# Patient Record
Sex: Male | Born: 1970 | Race: Black or African American | Hispanic: No | Marital: Married | Smoking: Former smoker
Health system: Southern US, Community
[De-identification: ages and names within clinical notes are randomized; demographics above are authoritative.]

## PROBLEM LIST (undated history)

## (undated) DIAGNOSIS — T7840XA Allergy, unspecified, initial encounter: Secondary | ICD-10-CM

## (undated) DIAGNOSIS — J45909 Unspecified asthma, uncomplicated: Secondary | ICD-10-CM

## (undated) DIAGNOSIS — E669 Obesity, unspecified: Secondary | ICD-10-CM

## (undated) DIAGNOSIS — E291 Testicular hypofunction: Secondary | ICD-10-CM

## (undated) DIAGNOSIS — N200 Calculus of kidney: Secondary | ICD-10-CM

## (undated) DIAGNOSIS — R0902 Hypoxemia: Secondary | ICD-10-CM

## (undated) HISTORY — DX: Calculus of kidney: N20.0

## (undated) HISTORY — DX: Unspecified asthma, uncomplicated: J45.909

## (undated) HISTORY — DX: Allergy, unspecified, initial encounter: T78.40XA

## (undated) HISTORY — DX: Obesity, unspecified: E66.9

## (undated) HISTORY — DX: Testicular hypofunction: E29.1

## (undated) HISTORY — DX: Hypoxemia: R09.02

## (undated) HISTORY — PX: NO PAST SURGERIES: SHX2092

---

## 2012-07-03 ENCOUNTER — Ambulatory Visit (INDEPENDENT_AMBULATORY_CARE_PROVIDER_SITE_OTHER): Payer: Worker's Compensation

## 2012-07-03 ENCOUNTER — Other Ambulatory Visit: Payer: Self-pay | Admitting: Family Medicine

## 2012-07-03 DIAGNOSIS — R52 Pain, unspecified: Secondary | ICD-10-CM

## 2012-07-03 DIAGNOSIS — M25569 Pain in unspecified knee: Secondary | ICD-10-CM

## 2012-07-03 DIAGNOSIS — M25469 Effusion, unspecified knee: Secondary | ICD-10-CM

## 2012-07-03 DIAGNOSIS — S99929A Unspecified injury of unspecified foot, initial encounter: Secondary | ICD-10-CM

## 2012-07-03 DIAGNOSIS — X500XXA Overexertion from strenuous movement or load, initial encounter: Secondary | ICD-10-CM

## 2012-07-09 ENCOUNTER — Encounter: Payer: Self-pay | Admitting: Sports Medicine

## 2012-07-09 ENCOUNTER — Ambulatory Visit (INDEPENDENT_AMBULATORY_CARE_PROVIDER_SITE_OTHER): Payer: Worker's Compensation | Admitting: Sports Medicine

## 2012-07-09 VITALS — BP 113/75 | HR 66 | Ht 69.5 in | Wt 232.0 lb

## 2012-07-09 DIAGNOSIS — J302 Other seasonal allergic rhinitis: Secondary | ICD-10-CM | POA: Insufficient documentation

## 2012-07-09 DIAGNOSIS — H6120 Impacted cerumen, unspecified ear: Secondary | ICD-10-CM | POA: Insufficient documentation

## 2012-07-09 DIAGNOSIS — L909 Atrophic disorder of skin, unspecified: Secondary | ICD-10-CM

## 2012-07-09 DIAGNOSIS — Z299 Encounter for prophylactic measures, unspecified: Secondary | ICD-10-CM

## 2012-07-09 DIAGNOSIS — Z0000 Encounter for general adult medical examination without abnormal findings: Secondary | ICD-10-CM | POA: Insufficient documentation

## 2012-07-09 DIAGNOSIS — L918 Other hypertrophic disorders of the skin: Secondary | ICD-10-CM | POA: Insufficient documentation

## 2012-07-09 DIAGNOSIS — J309 Allergic rhinitis, unspecified: Secondary | ICD-10-CM

## 2012-07-09 MED ORDER — MONTELUKAST SODIUM 10 MG PO TABS: 10.0000 mg | ORAL_TABLET | Freq: Every day | ORAL | Status: DC

## 2012-07-09 NOTE — Assessment & Plan Note (Signed)
Well controlled with Singulair, needs a refill.

## 2012-07-09 NOTE — Progress Notes (Signed)
Subjective:    CC: Establish care.   HPI:  Hearing: Jose Rojas does get frequent cerumen impactions, he feels he has some today, and is wondering if we can flush the ears.  Allergies: Needs refill of Singulair, and this works well.  Skin lesions: Has multiple on his back and neck and is wondering what they are. They're nonpainful, and are not changing very much.  Preventative measure: Desires complete physical exam and some screening blood work.  Past medical history, Surgical history, Family history not pertinant except as noted below, Social history, Allergies, and medications have been entered into the medical record, reviewed, and no changes needed.   Review of Systems: No headache, visual changes, nausea, vomiting, diarrhea, constipation, dizziness, abdominal pain, skin rash, fevers, chills, night sweats, swollen lymph nodes, weight loss, chest pain, body aches, joint swelling, muscle aches, shortness of breath, mood changes, visual or auditory hallucinations.  Objective:    General: Well Developed, well nourished, and in no acute distress.  Neuro: Alert and oriented x3, extra-ocular muscles intact, sensation grossly intact.  HEENT: Normocephalic, atraumatic, pupils equal round reactive to light, neck supple, no masses, no lymphadenopathy, thyroid nonpalpable. Oropharynx, nasopharynx unremarkable, external ear canals show cerumen impactions bilaterally, we flushed the ears and I removed from both sides a large amount of cerumen manually with a curette. Skin: Warm and dry, no rashes noted. Multiple skin tags, approximately 15 noted on neck and upper back. Cardiac: Regular rate and rhythm, no murmurs rubs or gallops.  Respiratory: Clear to auscultation bilaterally. Not using accessory muscles, speaking in full sentences.  Abdominal: Soft, nontender, nondistended, positive bowel sounds, no masses, no organomegaly.  Musculoskeletal: Shoulder, elbow, wrist, hip, knee, ankle stable, and with full  range of motion. Impression and Recommendations:   The patient was counselled, risk factors were discussed, anticipatory guidance given.

## 2012-07-09 NOTE — Assessment & Plan Note (Signed)
Flushed. I also removed a significant amount manually with a curette.

## 2012-07-09 NOTE — Assessment & Plan Note (Signed)
There are multiple on the neck and back, he will schedule an appointment to have these removed.

## 2012-07-09 NOTE — Assessment & Plan Note (Signed)
Checking some routine blood work, he will then come to see me for a separate visit for a complete physical and preventive exam.

## 2012-07-09 NOTE — Addendum Note (Signed)
Addended by: Monica Becton on: 07/09/2012 03:17 PM   Modules accepted: Orders

## 2012-07-10 LAB — COMPREHENSIVE METABOLIC PANEL WITH GFR
Albumin: 4.6 g/dL (ref 3.5–5.2)
CO2: 27 meq/L (ref 19–32)
Calcium: 9.6 mg/dL (ref 8.4–10.5)
Chloride: 106 meq/L (ref 96–112)
Glucose, Bld: 100 mg/dL — ABNORMAL HIGH (ref 70–99)
Potassium: 4.3 meq/L (ref 3.5–5.3)
Sodium: 139 meq/L (ref 135–145)
Total Bilirubin: 0.8 mg/dL (ref 0.3–1.2)
Total Protein: 7.1 g/dL (ref 6.0–8.3)

## 2012-07-10 LAB — CBC
HCT: 42.5 % (ref 39.0–52.0)
Hemoglobin: 14.7 g/dL (ref 13.0–17.0)
MCH: 28.3 pg (ref 26.0–34.0)
MCHC: 34.6 g/dL (ref 30.0–36.0)
MCV: 81.9 fL (ref 78.0–100.0)
Platelets: 163 10*3/uL (ref 150–400)
RBC: 5.19 MIL/uL (ref 4.22–5.81)
RDW: 16.3 % — ABNORMAL HIGH (ref 11.5–15.5)
WBC: 6.6 10*3/uL (ref 4.0–10.5)

## 2012-07-10 LAB — HEMOGLOBIN A1C
Hgb A1c MFr Bld: 5.5 % (ref <5.7)
Mean Plasma Glucose: 111 mg/dL (ref <117)

## 2012-07-10 LAB — COMPREHENSIVE METABOLIC PANEL
ALT: 28 U/L (ref 0–53)
AST: 20 U/L (ref 0–37)
Alkaline Phosphatase: 66 U/L (ref 39–117)
BUN: 11 mg/dL (ref 6–23)
Creat: 0.94 mg/dL (ref 0.50–1.35)

## 2012-07-10 LAB — LIPID PANEL
Cholesterol: 173 mg/dL (ref 0–200)
HDL: 37 mg/dL — ABNORMAL LOW (ref >39)
LDL Cholesterol: 117 mg/dL — ABNORMAL HIGH (ref 0–99)
Total CHOL/HDL Ratio: 4.7 Ratio
Triglycerides: 94 mg/dL (ref <150)
VLDL: 19 mg/dL (ref 0–40)

## 2012-07-10 LAB — TSH: TSH: 1.445 u[IU]/mL (ref 0.350–4.500)

## 2012-07-11 LAB — VITAMIN D 25 HYDROXY (VIT D DEFICIENCY, FRACTURES): Vit D, 25-Hydroxy: 23 ng/mL — ABNORMAL LOW (ref 30–89)

## 2012-07-12 MED ORDER — VITAMIN D (ERGOCALCIFEROL) 1.25 MG (50000 UNIT) PO CAPS: 50000.0000 [IU] | ORAL_CAPSULE | ORAL | Status: DC

## 2012-07-12 NOTE — Addendum Note (Signed)
Addended by: Monica Becton on: 07/12/2012 10:38 AM   Modules accepted: Orders

## 2012-07-13 LAB — TESTOSTERONE, FREE, TOTAL, SHBG
Sex Hormone Binding: 25 nmol/L (ref 13–71)
Testosterone, Free: 65.5 pg/mL (ref 47.0–244.0)
Testosterone-% Free: 2.3 % (ref 1.6–2.9)
Testosterone: 287 ng/dL — ABNORMAL LOW (ref 300–890)

## 2012-07-15 ENCOUNTER — Encounter: Payer: Self-pay | Admitting: Sports Medicine

## 2012-07-15 ENCOUNTER — Ambulatory Visit (INDEPENDENT_AMBULATORY_CARE_PROVIDER_SITE_OTHER): Payer: BC Managed Care – PPO | Admitting: Sports Medicine

## 2012-07-15 VITALS — BP 129/87 | HR 61 | Wt 233.0 lb

## 2012-07-15 DIAGNOSIS — L910 Hypertrophic scar: Secondary | ICD-10-CM | POA: Insufficient documentation

## 2012-07-15 DIAGNOSIS — J45909 Unspecified asthma, uncomplicated: Secondary | ICD-10-CM | POA: Insufficient documentation

## 2012-07-15 DIAGNOSIS — L731 Pseudofolliculitis barbae: Secondary | ICD-10-CM | POA: Insufficient documentation

## 2012-07-15 DIAGNOSIS — E291 Testicular hypofunction: Secondary | ICD-10-CM | POA: Insufficient documentation

## 2012-07-15 DIAGNOSIS — Z299 Encounter for prophylactic measures, unspecified: Secondary | ICD-10-CM

## 2012-07-15 DIAGNOSIS — Z0000 Encounter for general adult medical examination without abnormal findings: Secondary | ICD-10-CM

## 2012-07-15 DIAGNOSIS — L918 Other hypertrophic disorders of the skin: Secondary | ICD-10-CM

## 2012-07-15 MED ORDER — ALBUTEROL SULFATE HFA 108 (90 BASE) MCG/ACT IN AERS: 2.0000 | INHALATION_SPRAY | RESPIRATORY_TRACT | Status: DC | PRN

## 2012-07-15 NOTE — Patient Instructions (Addendum)
Exercise prescription:  You should adjust the intensity of your exercise based on your heart rate. The [employer] sports medicine recommends keeping your heart rate between 70-80% of its maximum for 30 minutes, 3-5 times per week. Maximum heart rate = (220 - age). Multiply this number by 0.75 to get your goal heart rate. If lower, then increase the intensity of your exercise. If the number is higher, you may decrease the intensity of your exercise.  Fat and Cholesterol Control Diet Cholesterol levels in your body are determined significantly by your diet. Cholesterol levels may also be related to heart disease. The following material helps to explain this relationship and discusses what you can do to help keep your heart healthy. Not all cholesterol is bad. Low-density lipoprotein (LDL) cholesterol is the "bad" cholesterol. It may cause fatty deposits to build up inside your arteries. High-density lipoprotein (HDL) cholesterol is "good." It helps to remove the "bad" LDL cholesterol from your blood. Cholesterol is a very important risk factor for heart disease. Other risk factors are high blood pressure, smoking, stress, heredity, and weight. The heart muscle gets its supply of blood through the coronary arteries. If your LDL cholesterol is high and your HDL cholesterol is low, you are at risk for having fatty deposits build up in your coronary arteries. This leaves less room through which blood can flow. Without sufficient blood and oxygen, the heart muscle cannot function properly and you may feel chest pains (angina pectoris). When a coronary artery closes up entirely, a part of the heart muscle may die causing a heart attack (myocardial infarction). CHECKING CHOLESTEROL When your caregiver sends your blood to a lab to be examined for cholesterol, a complete lipid (fat) profile may be done. With this test, the total amount of cholesterol and levels of LDL and HDL are determined. Triglycerides  are a type of fat that circulates in the blood. They can also be used to determine heart disease risk. The list below describes what the numbers should be: Test: Total Cholesterol.  Less than 200 mg/dl. Test: LDL "bad cholesterol."  Less than 100 mg/dl.  Less than 70 mg/dl if you are at very high risk of a heart attack or sudden cardiac death. Test: HDL "good cholesterol."  Greater than 50 mg/dl for women.  Greater than 40 mg/dl for men. Test: Triglycerides.  Less than 150 mg/dl. CONTROLLING CHOLESTEROL WITH DIET Although exercise and lifestyle factors are important, your diet is key. That is because certain foods are known to raise cholesterol and others to lower it. The goal is to balance foods for their effect on cholesterol and more importantly, to replace saturated and trans fat with other types of fat, such as monounsaturated fat, polyunsaturated fat, and omega-3 fatty acids. On average, a person should consume no more than 15 to 17 g of saturated fat daily. Saturated and trans fats are considered "bad" fats, and they will raise LDL cholesterol. Saturated fats are primarily found in animal products such as meats, butter, and cream. However, that does not mean you need to give up all your favorite foods. Today, there are good tasting, low-fat, low-cholesterol substitutes for most of the things you like to eat. Choose low-fat or nonfat alternatives. Choose round or loin cuts of red meat. These types of cuts are lowest in fat and cholesterol. Chicken (without the skin), fish, veal, and ground [place] breast are great choices. Eliminate fatty meats, such as hot dogs and salami. Even shellfish have little or no saturated  fat. Have a 3 oz (85 g) portion when you eat lean meat, poultry, or fish. Trans fats are also called "partially hydrogenated oils." They are oils that have been scientifically manipulated so that they are solid at room temperature resulting in a longer shelf life and improved  taste and texture of foods in which they are added. Trans fats are found in stick margarine, some tub margarines, cookies, crackers, and baked goods.  When baking and cooking, oils are a great substitute for butter. The monounsaturated oils are especially beneficial since it is believed they lower LDL and raise HDL. The oils you should avoid entirely are saturated tropical oils, such as coconut and palm.  Remember to eat a lot from food groups that are naturally free of saturated and trans fat, including fish, fruit, vegetables, beans, grains (barley, rice, couscous, bulgur wheat), and pasta (without cream sauces).  IDENTIFYING FOODS THAT LOWER CHOLESTEROL  Soluble fiber may lower your cholesterol. This type of fiber is found in fruits such as apples, vegetables such as broccoli, potatoes, and carrots, legumes such as beans, peas, and lentils, and grains such as barley. Foods fortified with plant sterols (phytosterol) may also lower cholesterol. You should eat at least 2 g per day of these foods for a cholesterol lowering effect.  Read package labels to identify low-saturated fats, trans fat free, and low-fat foods at the supermarket. Select cheeses that have only 2 to 3 g saturated fat per ounce. Use a heart-healthy tub margarine that is free of trans fats or partially hydrogenated oil. When buying baked goods (cookies, crackers), avoid partially hydrogenated oils. Breads and muffins should be made from whole grains (whole-wheat or whole oat flour, instead of "flour" or "enriched flour"). Buy non-creamy canned soups with reduced salt and no added fats.  FOOD PREPARATION TECHNIQUES  Never deep-fry. If you must fry, either stir-fry, which uses very little fat, or use non-stick cooking sprays. When possible, broil, bake, or roast meats, and steam vegetables. Instead of putting butter or margarine on vegetables, use lemon and herbs, applesauce, and cinnamon (for squash and sweet potatoes), nonfat yogurt, salsa,  and low-fat dressings for salads.  LOW-SATURATED FAT / LOW-FAT FOOD SUBSTITUTES Meats / Saturated Fat (g)  Avoid: Steak, marbled (3 oz/85 g) / 11 g  Choose: Steak, lean (3 oz/85 g) / 4 g  Avoid: Hamburger (3 oz/85 g) / 7 g  Choose: Hamburger, lean (3 oz/85 g) / 5 g  Avoid: Ham (3 oz/85 g) / 6 g  Choose: Ham, lean cut (3 oz/85 g) / 2.4 g  Avoid: Chicken, with skin, dark meat (3 oz/85 g) / 4 g  Choose: Chicken, skin removed, dark meat (3 oz/85 g) / 2 g  Avoid: Chicken, with skin, light meat (3 oz/85 g) / 2.5 g  Choose: Chicken, skin removed, light meat (3 oz/85 g) / 1 g Dairy / Saturated Fat (g)  Avoid: Whole milk (1 cup) / 5 g  Choose: Low-fat milk, 2% (1 cup) / 3 g  Choose: Low-fat milk, 1% (1 cup) / 1.5 g  Choose: Skim milk (1 cup) / 0.3 g  Avoid: Hard cheese (1 oz/28 g) / 6 g  Choose: Skim milk cheese (1 oz/28 g) / 2 to 3 g  Avoid: Cottage cheese, 4% fat (1 cup) / 6.5 g  Choose: Low-fat cottage cheese, 1% fat (1 cup) / 1.5 g  Avoid: Ice cream (1 cup) / 9 g  Choose: Sherbet (1 cup) / 2.5 g  Choose:  Nonfat frozen yogurt (1 cup) / 0.3 g  Choose: Frozen fruit bar / trace  Avoid: Whipped cream (1 tbs) / 3.5 g  Choose: Nondairy whipped topping (1 tbs) / 1 g Condiments / Saturated Fat (g)  Avoid: Mayonnaise (1 tbs) / 2 g  Choose: Low-fat mayonnaise (1 tbs) / 1 g  Avoid: Butter (1 tbs) / 7 g  Choose: Extra light margarine (1 tbs) / 1 g  Avoid: Coconut oil (1 tbs) / 11.8 g  Choose: Olive oil (1 tbs) / 1.8 g  Choose: Corn oil (1 tbs) / 1.7 g  Choose: Safflower oil (1 tbs) / 1.2 g  Choose: Sunflower oil (1 tbs) / 1.4 g  Choose: Soybean oil (1 tbs) / 2.4 g  Choose: Canola oil (1 tbs) / 1 g Document Released: 05/20/2005 Document Revised: 08/12/2011 Document Reviewed: 11/08/2010 ExitCare Patient Information 2013 ExitCare,     What is this medicine? TESTOSTERONE (tes TOS ter one) is the main male hormone. It supports normal male development such  as muscle growth, facial hair, and deep voice. It is used in males to treat low testosterone levels. This medicine may be used for other purposes; ask your health care provider or pharmacist if you have questions. What should I tell my health care provider before I take this medicine? They need to know if you have any of these conditions: -breast cancer -diabetes -heart disease -kidney disease -liver disease -lung disease -prostate cancer, enlargement -an unusual or allergic reaction to testosterone, other medicines, foods, dyes, or preservatives -pregnant or trying to get pregnant -breast-feeding How should I use this medicine? This medicine is for injection into a muscle. It is usually given by a health care professional in a hospital or clinic setting. Contact your pediatrician regarding the use of this medicine in children. While this medicine may be prescribed for children as young as 65 years of age for selected conditions, precautions do apply. Overdosage: If you think you have taken too much of this medicine contact a poison control center or emergency room at once. NOTE: This medicine is only for you. Do not share this medicine with others. What if I miss a dose? Try not to miss a dose. Your doctor or health care professional will tell you when your next injection is due. Notify the office if you are unable to keep an appointment. What may interact with this medicine? -medicines for diabetes -medicines that treat or prevent blood clots like warfarin -oxyphenbutazone -propranolol -steroid medicines like prednisone or cortisone This list may not describe all possible interactions. Give your health care provider a list of all the medicines, herbs, non-prescription drugs, or dietary supplements you use. Also tell them if you smoke, drink alcohol, or use illegal drugs. Some items may interact with your medicine. What should I watch for while using this medicine? Visit your doctor or  health care professional for regular checks on your progress. They will need to check the level of testosterone in your blood. This medicine may affect blood sugar levels. If you have diabetes, check with your doctor or health care professional before you change your diet or the dose of your diabetic medicine. This drug is banned from use in athletes by most athletic organizations. What side effects may I notice from receiving this medicine? Side effects that you should report to your doctor or health care professional as soon as possible: -allergic reactions like skin rash, itching or hives, swelling of the face, lips, or tongue -breast enlargement -  breathing problems -changes in mood, especially anger, depression, or rage -dark urine -general ill feeling or flu-like symptoms -light-colored stools -loss of appetite, nausea -nausea, vomiting -right upper belly pain -stomach pain -swelling of ankles -too frequent or persistent erections -trouble passing urine or change in the amount of urine -unusually weak or tired -yellowing of the eyes or skin Additional side effects that can occur in women include: -deep or hoarse voice -facial hair growth -irregular menstrual periods Side effects that usually do not require medical attention (report to your doctor or health care professional if they continue or are bothersome): -acne -change in sex drive or performance -hair loss -headache This list may not describe all possible side effects. Call your doctor for medical advice about side effects. You may report side effects to FDA at 1-800-FDA-1088. Where should I keep my medicine? Keep out of the reach of children. This medicine can be abused. Keep your medicine in a safe place to protect it from theft. Do not share this medicine with anyone. Selling or giving away this medicine is dangerous and against the law. Store at room temperature between 20 and 25 degrees C (68 and 77 degrees F). Do not  freeze. Protect from light. Follow the directions for the product you are prescribed. Throw away any unused medicine after the expiration date. NOTE: This sheet is a summary. It may not cover all possible information. If you have questions about this medicine, talk to your doctor, pharmacist, or health care provider.  2013, [employer]. (07/31/2007 4:13:46 PM)

## 2012-07-15 NOTE — Assessment & Plan Note (Signed)
Note written to allow him to grow a bearded work. At some point we could certainly consider a vitamin A topical analog.

## 2012-07-15 NOTE — Assessment & Plan Note (Signed)
I've advised him that if he desires we can consider intralesional steroid injections to shrink the keloid.

## 2012-07-15 NOTE — Assessment & Plan Note (Signed)
We discussed weight loss, and exercise. I'm giving him an exercise prescription. He will think about testosterone supplementation.

## 2012-07-15 NOTE — Assessment & Plan Note (Signed)
Complete physical performed today. 

## 2012-07-15 NOTE — Assessment & Plan Note (Signed)
We'll make a separate visit for excision.

## 2012-07-15 NOTE — Assessment & Plan Note (Signed)
Refilling albuterol. 

## 2012-07-15 NOTE — Progress Notes (Signed)
Subjective:    CC: Complete physical  HPI: Jose Rojas presents for complete physical exam, he does have several issues he would like to discuss.  Keloid: Present on chest, he did have one in the past on his ear, he was excised, and returned as expected, it was then injected and disappeared.  Skin tags: Understands he does need a separate appointment to excise these. He has them on his neck and in his axilla.  Hypogonadism: Recent testosterone level was low, he has no problem with sex drive or erections, but does have some fatigue and difficulty gaining we muscle mass.  Lipids: LDL was elevated but free mildly so, he would like to try dietary modification.  Asthma: Needs refill on albuterol.  Facial rash: Due to his job he is required to be clean shaven, unfortunately he develops multiple bumps, irritation, redness, and swelling with shaving. He is wondering if we can write him a note to grow a beard at work.  Past medical history, Surgical history, Family history not pertinant except as noted below, Social history, Allergies, and medications have been entered into the medical record, reviewed, and no changes needed.   Review of Systems: No headache, visual changes, nausea, vomiting, diarrhea, constipation, dizziness, abdominal pain, skin rash, fevers, chills, night sweats, swollen lymph nodes, weight loss, chest pain, body aches, joint swelling, muscle aches, shortness of breath, mood changes, visual or auditory hallucinations.  Objective:   General Appearance: Alert, well developed and well nourished, cooperative, no distress.  Head: Normocephalic, no obvious abnormality  Eyes: PERRL, EOM's intact, conjunctiva and corneas clear.  Nose: Nares symmetrical, septum midline, mucosa pink, clear watery discharge; no sinus tenderness  Throat: Lips, tongue, and mucosa are moist, pink, and intact; teeth intact  Neck: Supple, symmetrical, trachea midline, no adenopathy; thyroid: no enlargement,  symmetric,no tenderness/mass/nodules; no carotid bruit, no JVD  Back: Symmetrical, no curvature, ROM normal, no CVA tenderness  Chest/Breast: No mass or tenderness, there is a 3 cm keloid on his anterior chest. Lungs: Clear to auscultation bilaterally, respirations unlabored  Heart: Normal PMI, no lower extremity edema, regular rate & rhythm, S1 and S2 normal, no murmurs, rubs, or gallops. Abdomen: Soft, non-tender, bowel sounds active all four quadrants, no mass, or organomegaly  Musculoskeletal: All joints, extremities examined, non-tender and unremarkable.  Lymphatic: No adenopathy  Skin/Hair/Nails: Skin warm, dry, and intact, no abnormal dyspigmentation, there are multiple papules located on his face in a beard distribution. Neurologic: Alert and oriented x3, no cranial nerve deficits, normal strength and tone, gait steady  Impression and Recommendations:   The patient was counselled, risk factors were discussed, anticipatory guidance given.

## 2013-01-28 ENCOUNTER — Emergency Department (HOSPITAL_BASED_OUTPATIENT_CLINIC_OR_DEPARTMENT_OTHER)
Admission: EM | Admit: 2013-01-28 | Discharge: 2013-01-29 | Disposition: A | Discharge status: Home / Self Care | Payer: Worker's Compensation | Attending: Emergency Medicine | Admitting: Emergency Medicine

## 2013-01-28 ENCOUNTER — Encounter (HOSPITAL_BASED_OUTPATIENT_CLINIC_OR_DEPARTMENT_OTHER): Payer: Self-pay | Admitting: *Deleted

## 2013-01-28 DIAGNOSIS — Z79899 Other long term (current) drug therapy: Secondary | ICD-10-CM | POA: Insufficient documentation

## 2013-01-28 DIAGNOSIS — Y9389 Activity, other specified: Secondary | ICD-10-CM | POA: Insufficient documentation

## 2013-01-28 DIAGNOSIS — Z23 Encounter for immunization: Secondary | ICD-10-CM | POA: Insufficient documentation

## 2013-01-28 DIAGNOSIS — Y92410 Unspecified street and highway as the place of occurrence of the external cause: Secondary | ICD-10-CM | POA: Insufficient documentation

## 2013-01-28 DIAGNOSIS — W268XXA Contact with other sharp object(s), not elsewhere classified, initial encounter: Secondary | ICD-10-CM | POA: Insufficient documentation

## 2013-01-28 DIAGNOSIS — Z87891 Personal history of nicotine dependence: Secondary | ICD-10-CM | POA: Insufficient documentation

## 2013-01-28 DIAGNOSIS — S058X9A Other injuries of unspecified eye and orbit, initial encounter: Secondary | ICD-10-CM | POA: Insufficient documentation

## 2013-01-28 DIAGNOSIS — S01111A Laceration without foreign body of right eyelid and periocular area, initial encounter: Secondary | ICD-10-CM
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
## 2013-01-29 MED ORDER — TETANUS-DIPHTH-ACELL PERTUSSIS 5-2.5-18.5 LF-MCG/0.5 IM SUSP
0.5000 mL | Freq: Once | INTRAMUSCULAR | Status: AC
  Administered 2013-01-29: 0.5 mL via INTRAMUSCULAR
  Filled 2013-01-29: qty 0.5
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
## 2013-02-04 ENCOUNTER — Ambulatory Visit (INDEPENDENT_AMBULATORY_CARE_PROVIDER_SITE_OTHER): Payer: BC Managed Care – PPO | Admitting: Sports Medicine

## 2013-02-04 ENCOUNTER — Encounter: Payer: Self-pay | Admitting: Sports Medicine

## 2013-02-04 VITALS — BP 137/88 | HR 61 | Wt 227.0 lb

## 2013-02-04 DIAGNOSIS — E291 Testicular hypofunction: Secondary | ICD-10-CM

## 2013-02-04 MED ORDER — TESTOSTERONE CYPIONATE 200 MG/ML IM SOLN
200.0000 mg | INTRAMUSCULAR | Status: DC
  Administered 2013-02-04: 200 mg via INTRAMUSCULAR

## 2013-02-04 NOTE — Progress Notes (Signed)
  Subjective:    CC: Testosterone  HPI: Male hypogonadism: We does note decreased sex drive, poor erections, fatigue. He does desire to start testosterone supplementation. He prefers parenteral.  He works in [place], and will have some trouble getting here but he will make time out of his day for the next month or 2, by then I hope to train him to do his own shots at home.  Past medical history, Surgical history, Family history not pertinant except as noted below, Social history, Allergies, and medications have been entered into the medical record, reviewed, and no changes needed.   Review of Systems: No fevers, chills, night sweats, weight loss, chest pain, or shortness of breath.   Objective:    General: Well Developed, well nourished, and in no acute distress.  Neuro: Alert and oriented x3, extra-ocular muscles intact, sensation grossly intact.  HEENT: Normocephalic, atraumatic, pupils equal round reactive to light, neck supple, no masses, no lymphadenopathy, thyroid nonpalpable.  Skin: Warm and dry, no rashes. Cardiac: Regular rate and rhythm, no murmurs rubs or gallops, no lower extremity edema.  Respiratory: Clear to auscultation bilaterally. Not using accessory muscles, speaking in full sentences.  Impression and Recommendations:

## 2013-02-04 NOTE — Assessment & Plan Note (Signed)
Testosterone 200 mg intramuscular. We will do this every 2 weeks, we will check testosterone levels one week after his second injection. I would like him to learn how to do his own shots.

## 2013-05-04 ENCOUNTER — Telehealth: Payer: Self-pay

## 2013-05-04 DIAGNOSIS — E291 Testicular hypofunction: Secondary | ICD-10-CM

## 2013-05-04 MED ORDER — TESTOSTERONE CYPIONATE 200 MG/ML IM SOLN: 200.0000 mg | INTRAMUSCULAR | Status: DC

## 2013-05-04 NOTE — Telephone Encounter (Signed)
Sent prescription

## 2013-05-05 ENCOUNTER — Other Ambulatory Visit: Payer: Self-pay

## 2013-05-28 ENCOUNTER — Ambulatory Visit (INDEPENDENT_AMBULATORY_CARE_PROVIDER_SITE_OTHER): Payer: Self-pay | Admitting: Sports Medicine

## 2013-05-28 VITALS — BP 148/96 | HR 68 | Wt 233.0 lb

## 2013-05-28 DIAGNOSIS — E291 Testicular hypofunction: Secondary | ICD-10-CM

## 2013-05-28 MED ORDER — TESTOSTERONE CYPIONATE 200 MG/ML IM SOLN
200.0000 mg | INTRAMUSCULAR | Status: DC
  Administered 2013-05-28: 200 mg via INTRAMUSCULAR

## 2013-05-28 NOTE — Assessment & Plan Note (Signed)
Testosterone injection as above. 

## 2013-05-28 NOTE — Progress Notes (Signed)
Patient was seen for Testosterone injection, 200 mg Testosterone Cypionate was given RUOQ. Patient denied any symptoms associated with the injection.  Patient will have his labs done to check testosterone 1 week from today. Rhonda Cunningham,CMA

## 2013-06-07 LAB — TESTOSTERONE, FREE, TOTAL, SHBG
Sex Hormone Binding: 23 nmol/L (ref 13–71)
Testosterone, Free: 163.9 pg/mL (ref 47.0–244.0)
Testosterone-% Free: 2.6 % (ref 1.6–2.9)
Testosterone: 625 ng/dL (ref 300–890)

## 2013-06-10 ENCOUNTER — Telehealth: Payer: Self-pay

## 2013-06-10 NOTE — Telephone Encounter (Signed)
Spoke to patient he stated that he felt a little different from when he got the first injection, he stated that he was working out at the time. Patient has appt scheduled for 06/14/13 and he will see how he feels after this injection and call to let us know. Brynn Reznik,CMA

## 2013-06-10 NOTE — Telephone Encounter (Signed)
Testosterone levels are normal because of the injections, if we stop them they would go back down. Is he feeling any better now that the levels are normal.

## 2013-06-10 NOTE — Telephone Encounter (Signed)
Patient wants to know that since his lab results were normal do he still need to continue the testosterone injections? Chaquita Basques,CMA

## 2013-06-14 ENCOUNTER — Ambulatory Visit (INDEPENDENT_AMBULATORY_CARE_PROVIDER_SITE_OTHER): Payer: Managed Care, Other (non HMO) | Admitting: Sports Medicine

## 2013-06-14 ENCOUNTER — Encounter: Payer: Self-pay | Admitting: *Deleted

## 2013-06-14 VITALS — BP 146/89 | HR 75 | Wt 234.0 lb

## 2013-06-14 DIAGNOSIS — E291 Testicular hypofunction: Secondary | ICD-10-CM

## 2013-06-14 MED ORDER — TESTOSTERONE CYPIONATE 200 MG/ML IM SOLN
200.0000 mg | INTRAMUSCULAR | Status: DC
  Administered 2013-06-14: 200 mg via INTRAMUSCULAR

## 2013-06-14 NOTE — Progress Notes (Signed)
Testosterone injection given.

## 2013-06-14 NOTE — Assessment & Plan Note (Signed)
Testosterone injection given as above.

## 2013-06-16 ENCOUNTER — Telehealth: Payer: Self-pay

## 2013-06-16 NOTE — Telephone Encounter (Signed)
Patient called stated that he was in the office on 06/14/13 for Testosterone injection and he stated that he has been having headaches and feeling dizzy he wants to know what he should do at this point. He stated that he did not have these symptoms when he first got the injection done.  Rhonda Cunningham,CMA

## 2013-06-16 NOTE — Telephone Encounter (Signed)
Please have him see me tomorrow if headaches continue. This is unlikely due to testosterone supplementation.

## 2013-06-16 NOTE — Telephone Encounter (Signed)
Spoke to patient advised him to schedule appt to come in for headaches. Rhonda Cunningham,CMA

## 2013-06-25 ENCOUNTER — Ambulatory Visit (INDEPENDENT_AMBULATORY_CARE_PROVIDER_SITE_OTHER): Payer: Managed Care, Other (non HMO) | Admitting: Sports Medicine

## 2013-06-25 ENCOUNTER — Encounter: Payer: Self-pay | Admitting: Sports Medicine

## 2013-06-25 VITALS — BP 131/89 | HR 65 | Wt 229.0 lb

## 2013-06-25 DIAGNOSIS — E291 Testicular hypofunction: Secondary | ICD-10-CM

## 2013-06-25 MED ORDER — TESTOSTERONE CYPIONATE 200 MG/ML IM SOLN
200.0000 mg | Freq: Once | INTRAMUSCULAR | Status: AC
  Administered 2013-06-25: 200 mg via INTRAMUSCULAR

## 2013-06-25 MED ORDER — TADALAFIL 5 MG PO TABS: 5.0000 mg | ORAL_TABLET | Freq: Every day | ORAL | Status: DC

## 2013-06-25 NOTE — Progress Notes (Signed)
  Subjective:    CC: Followup  HPI: Male hypogonadism colon excellent response with improved strength and libido after his first testosterone injection, unfortunately he waited 3-4 months before his next, and after the next injection he developed a throbbing unilateral headache that was worsened with bright lights and loud sounds. He then had his third injection and although he didn't get the same improvement in libido energy, he did not develop a headache. Since then he's been somewhat hesitant to continue with testosterone supplementation. Symptoms were moderate, persistent, his headache has resolved. No focal neurologic symptoms, no constitutional symptoms.  Past medical history, Surgical history, Family history not pertinant except as noted below, Social history, Allergies, and medications have been entered into the medical record, reviewed, and no changes needed.   Review of Systems: No fevers, chills, night sweats, weight loss, chest pain, or shortness of breath.   Objective:    General: Well Developed, well nourished, and in no acute distress.  Neuro: Alert and oriented x3, extra-ocular muscles intact, sensation grossly intact. Cranial nerves II through XII are intact, motor, sensory, and coordinative functions are all intact. HEENT: Normocephalic, atraumatic, pupils equal round reactive to light, neck supple, no masses, no lymphadenopathy, thyroid nonpalpable.  Skin: Warm and dry, no rashes. Cardiac: Regular rate and rhythm, no murmurs rubs or gallops, no lower extremity edema.  Respiratory: Clear to auscultation bilaterally. Not using accessory muscles, speaking in full sentences.  Impression and Recommendations:

## 2013-06-25 NOTE — Assessment & Plan Note (Signed)
Initially Jose Rojas had a fantastic response to testosterone supplementation. He skipped a few months, on the restart he had a bit of a headache, I do think that this was simply a coincidence with his testosterone injection. He will continue every 2 week injections I am also going to give him some Cialis samples. I would like to see him back in 3 months to evaluate response

## 2013-06-28 ENCOUNTER — Ambulatory Visit: Payer: Self-pay

## 2013-07-14 ENCOUNTER — Ambulatory Visit (INDEPENDENT_AMBULATORY_CARE_PROVIDER_SITE_OTHER): Payer: Managed Care, Other (non HMO) | Admitting: Sports Medicine

## 2013-07-14 DIAGNOSIS — E291 Testicular hypofunction: Secondary | ICD-10-CM

## 2013-07-14 MED ORDER — TESTOSTERONE CYPIONATE 200 MG/ML IM SOLN
200.0000 mg | INTRAMUSCULAR | Status: DC
  Administered 2013-07-14: 200 mg via INTRAMUSCULAR

## 2013-07-14 NOTE — Progress Notes (Signed)
   Subjective:    Patient ID: Jose Rojas, male    DOB: [birth year], 43 y.o.   MRN: [id number]  HPI    Review of Systems     Objective:   Physical Exam        Assessment & Plan:  Testosterone injection 234m given left ventrogluteal.  Pt tolerated well without any complications. KClemetine Marker LPN

## 2013-07-14 NOTE — Assessment & Plan Note (Signed)
Testosterone injection as above.

## 2013-07-23 ENCOUNTER — Encounter: Payer: Self-pay | Admitting: Sports Medicine

## 2013-07-23 ENCOUNTER — Ambulatory Visit (INDEPENDENT_AMBULATORY_CARE_PROVIDER_SITE_OTHER): Payer: Managed Care, Other (non HMO) | Admitting: Sports Medicine

## 2013-07-23 VITALS — BP 134/90 | HR 67 | Ht 69.0 in | Wt 230.0 lb

## 2013-07-23 DIAGNOSIS — E291 Testicular hypofunction: Secondary | ICD-10-CM

## 2013-07-23 DIAGNOSIS — J45909 Unspecified asthma, uncomplicated: Secondary | ICD-10-CM

## 2013-07-23 DIAGNOSIS — H6120 Impacted cerumen, unspecified ear: Secondary | ICD-10-CM

## 2013-07-23 DIAGNOSIS — J302 Other seasonal allergic rhinitis: Secondary | ICD-10-CM

## 2013-07-23 DIAGNOSIS — Z0000 Encounter for general adult medical examination without abnormal findings: Secondary | ICD-10-CM

## 2013-07-23 DIAGNOSIS — Z299 Encounter for prophylactic measures, unspecified: Secondary | ICD-10-CM

## 2013-07-23 DIAGNOSIS — L731 Pseudofolliculitis barbae: Secondary | ICD-10-CM

## 2013-07-23 MED ORDER — ALBUTEROL SULFATE HFA 108 (90 BASE) MCG/ACT IN AERS: 2.0000 | INHALATION_SPRAY | RESPIRATORY_TRACT | Status: DC | PRN

## 2013-07-23 MED ORDER — MONTELUKAST SODIUM 10 MG PO TABS: 10.0000 mg | ORAL_TABLET | Freq: Every day | ORAL | Status: DC

## 2013-07-23 NOTE — Progress Notes (Signed)
  Subjective:    CC: Complete physical  HPI:  Preventive measures: Doing well, here for a complete physical.  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  Hypogonadism: Doing extremely well with current dose of testosterone.  Past medical history, Surgical history, Family history not pertinant except as noted below, Social history, Allergies, and medications have been entered into the medical record, reviewed, and no changes needed.   Review of Systems: No headache, visual changes, nausea, vomiting, diarrhea, constipation, dizziness, abdominal pain, skin rash, fevers, chills, night sweats, swollen lymph nodes, weight loss, chest pain, body aches, joint swelling, muscle aches, shortness of breath, mood changes, visual or auditory hallucinations.  Objective:    General: Well Developed, well nourished, and in no acute distress.  Neuro: Alert and oriented x3, extra-ocular muscles intact, sensation grossly intact.  HEENT: Normocephalic, atraumatic, pupils equal round reactive to light, neck supple, no masses, no lymphadenopathy, thyroid nonpalpable. Oropharynx, nasopharynx, extremity or canal are unremarkable with the exception of the right extremity which is occluded with cerumen. Skin: Warm and dry, no rashes noted.  Cardiac: Regular rate and rhythm, no murmurs rubs or gallops.  Respiratory: Clear to auscultation bilaterally. Not using accessory muscles, speaking in full sentences.  Abdominal: Soft, nontender, nondistended, positive bowel sounds, no masses, no organomegaly.  Musculoskeletal: Shoulder, elbow, wrist, hip, knee, ankle stable, and with full range of motion.  Indication: Cerumen impaction of the ear(s) Medical necessity statement: On physical examination, cerumen impairs clinically significant portions of the external auditory canal, and tympanic membrane. Noted obstructive, copious cerumen that cannot be removed without magnification and instrumentations  requiring physician skills Consent: Discussed benefits and risks of procedure and verbal consent obtained Procedure: Patient was prepped for the procedure. Utilized an otoscope to assess and take note of the ear canal, the tympanic membrane, and the presence, amount, and placement of the cerumen. Gentle water irrigation and soft plastic curette was utilized to remove cerumen.  Post procedure examination: shows cerumen was completely removed. Patient tolerated procedure well. The patient is made aware that they may experience temporary vertigo, temporary hearing loss, and temporary discomfort. If these symptom last for more than 24 hours to call the clinic or proceed to the ED.  Impression and Recommendations:    The patient was counselled, risk factors were discussed, anticipatory guidance given.

## 2013-07-23 NOTE — Assessment & Plan Note (Signed)
Improved with growing out his beard.

## 2013-07-23 NOTE — Assessment & Plan Note (Signed)
Doing well, rechecking CBC, PSA. Testosterone levels last month were 600, and he feels significantly better.

## 2013-07-23 NOTE — Assessment & Plan Note (Signed)
Complete physical performed today.

## 2013-07-23 NOTE — Assessment & Plan Note (Signed)
Removed with curettage by physician and irrigation.

## 2013-08-11 ENCOUNTER — Ambulatory Visit (INDEPENDENT_AMBULATORY_CARE_PROVIDER_SITE_OTHER): Payer: Managed Care, Other (non HMO) | Admitting: Sports Medicine

## 2013-08-11 VITALS — BP 142/97 | HR 72 | Ht 69.0 in | Wt 226.0 lb

## 2013-08-11 DIAGNOSIS — E291 Testicular hypofunction: Secondary | ICD-10-CM

## 2013-08-11 LAB — CBC
HCT: 44 % (ref 39.0–52.0)
Hemoglobin: 15.2 g/dL (ref 13.0–17.0)
MCH: 28.5 pg (ref 26.0–34.0)
MCHC: 34.5 g/dL (ref 30.0–36.0)
MCV: 82.4 fL (ref 78.0–100.0)
Platelets: 175 10*3/uL (ref 150–400)
RBC: 5.34 MIL/uL (ref 4.22–5.81)
RDW: 15.8 % — ABNORMAL HIGH (ref 11.5–15.5)
WBC: 6.4 10*3/uL (ref 4.0–10.5)

## 2013-08-11 LAB — LIPID PANEL
Cholesterol: 166 mg/dL (ref 0–200)
HDL: 37 mg/dL — ABNORMAL LOW (ref >39)
LDL Cholesterol: 111 mg/dL — ABNORMAL HIGH (ref 0–99)
Total CHOL/HDL Ratio: 4.5 ratio
Triglycerides: 88 mg/dL (ref <150)
VLDL: 18 mg/dL (ref 0–40)

## 2013-08-11 LAB — HEMOGLOBIN A1C
Hgb A1c MFr Bld: 5.3 % (ref <5.7)
Mean Plasma Glucose: 105 mg/dL (ref <117)

## 2013-08-11 LAB — PSA, TOTAL AND FREE
PSA, Free Pct: 42 % (ref >25)
PSA, Free: 0.52 ng/mL
PSA: 1.25 ng/mL (ref <=4.00)

## 2013-08-11 MED ORDER — TESTOSTERONE CYPIONATE 200 MG/ML IM SOLN
200.0000 mg | INTRAMUSCULAR | Status: DC
  Administered 2013-08-11: 200 mg via INTRAMUSCULAR

## 2013-08-11 NOTE — Assessment & Plan Note (Signed)
Testosterone injection as above.

## 2013-08-11 NOTE — Progress Notes (Signed)
Patient was in office for testosterone injection. 200 mg DepoTestosterone was given RUOQ. Patient denied any headaches, mood swings or SOB. Shanti Agresti,CMA

## 2013-08-25 ENCOUNTER — Encounter: Payer: Self-pay | Admitting: *Deleted

## 2013-08-25 ENCOUNTER — Ambulatory Visit (INDEPENDENT_AMBULATORY_CARE_PROVIDER_SITE_OTHER): Payer: Managed Care, Other (non HMO) | Admitting: Sports Medicine

## 2013-08-25 VITALS — BP 140/89 | HR 61

## 2013-08-25 DIAGNOSIS — E291 Testicular hypofunction: Secondary | ICD-10-CM

## 2013-08-25 MED ORDER — TESTOSTERONE CYPIONATE 200 MG/ML IM SOLN
200.0000 mg | Freq: Once | INTRAMUSCULAR | Status: AC
  Administered 2013-08-25: 200 mg via INTRAMUSCULAR

## 2013-08-25 NOTE — Progress Notes (Signed)
   Subjective:    Patient ID: Jose Rojas, male    DOB: 10-Nov-1970, 43 y.o.   MRN: [id number] Pt here for testosterone injection. 273m given in RUOQ with no complications.  MOscar La LPN  HPI    Review of Systems     Objective:   Physical Exam        Assessment & Plan:

## 2013-08-25 NOTE — Assessment & Plan Note (Signed)
Testosterone injection as above.

## 2013-09-09 ENCOUNTER — Encounter: Payer: Self-pay | Admitting: *Deleted

## 2013-09-09 ENCOUNTER — Ambulatory Visit (INDEPENDENT_AMBULATORY_CARE_PROVIDER_SITE_OTHER): Payer: Managed Care, Other (non HMO) | Admitting: Sports Medicine

## 2013-09-09 VITALS — BP 119/77 | HR 59 | Wt 231.0 lb

## 2013-09-09 DIAGNOSIS — E291 Testicular hypofunction: Secondary | ICD-10-CM

## 2013-09-09 MED ORDER — TESTOSTERONE CYPIONATE 200 MG/ML IM SOLN
200.0000 mg | Freq: Once | INTRAMUSCULAR | Status: AC
  Administered 2013-09-09: 200 mg via INTRAMUSCULAR

## 2013-09-09 NOTE — Assessment & Plan Note (Signed)
Testosterone injection as above.

## 2013-09-09 NOTE — Progress Notes (Signed)
   Subjective:    Patient ID: Jose Rojas, male    DOB: April 04, 1971, 43 y.o.   MRN: [id number] Testosterone injection given IM, LUOQ.  No complications. No complaints of any side effects. Beatris Ship, CMA  HPI    Review of Systems     Objective:   Physical Exam        Assessment & Plan:

## 2013-09-23 ENCOUNTER — Ambulatory Visit: Payer: Self-pay | Admitting: *Deleted

## 2013-09-23 ENCOUNTER — Ambulatory Visit (INDEPENDENT_AMBULATORY_CARE_PROVIDER_SITE_OTHER): Payer: Managed Care, Other (non HMO) | Admitting: Sports Medicine

## 2013-09-23 ENCOUNTER — Encounter: Payer: Self-pay | Admitting: Sports Medicine

## 2013-09-23 VITALS — BP 127/78 | HR 61 | Ht 69.0 in | Wt 232.0 lb

## 2013-09-23 DIAGNOSIS — J309 Allergic rhinitis, unspecified: Secondary | ICD-10-CM

## 2013-09-23 DIAGNOSIS — E291 Testicular hypofunction: Secondary | ICD-10-CM

## 2013-09-23 DIAGNOSIS — Z299 Encounter for prophylactic measures, unspecified: Secondary | ICD-10-CM

## 2013-09-23 DIAGNOSIS — L738 Other specified follicular disorders: Secondary | ICD-10-CM

## 2013-09-23 DIAGNOSIS — L731 Pseudofolliculitis barbae: Secondary | ICD-10-CM

## 2013-09-23 DIAGNOSIS — L678 Other hair color and hair shaft abnormalities: Secondary | ICD-10-CM

## 2013-09-23 DIAGNOSIS — J302 Other seasonal allergic rhinitis: Secondary | ICD-10-CM

## 2013-09-23 MED ORDER — TESTOSTERONE CYPIONATE 200 MG/ML IM SOLN
200.0000 mg | INTRAMUSCULAR | Status: DC
  Administered 2013-09-23: 200 mg via INTRAMUSCULAR

## 2013-09-23 MED ORDER — LORATADINE-PSEUDOEPHEDRINE ER 10-240 MG PO TB24: 1.0000 | ORAL_TABLET | Freq: Every day | ORAL | Status: DC

## 2013-09-23 NOTE — Assessment & Plan Note (Signed)
Return in one year for complete physical.

## 2013-09-23 NOTE — Assessment & Plan Note (Signed)
Thinking about having another child with his wife, unfortunately this would mean we would need to stop testosterone injections, he's also had a vasectomy, he would need a referral to [facility] urology for a reversal. He we will discuss this once they make that decision.

## 2013-09-23 NOTE — Assessment & Plan Note (Signed)
Prescription for Claritin-D. Return as needed

## 2013-09-23 NOTE — Assessment & Plan Note (Signed)
Resolved

## 2013-09-23 NOTE — Progress Notes (Signed)
  Subjective:    CC: Followup  HPI: Hypogonadism : feeling much better in terms of sex drive, ability to gangrene muscle mass, and energy after starting testosterone supplementation. Most recent testosterone levels were in the mid 600s.  Family planning: Status post vasectomy, now desiring to have another child with his wife, he has 2 girls, he would like boy. He's not yet fully made the decision, he is going to discuss this with his wife as we would have to discontinue testosterone supplementation and he would have to see urologist for reversal of vasectomy.  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  Allergies: Needs a prescription for Claritin-D.  Past medical history, Surgical history, Family history not pertinant except as noted below, Social history, Allergies, and medications have been entered into the medical record, reviewed, and no changes needed.   Review of Systems: No fevers, chills, night sweats, weight loss, chest pain, or shortness of breath.   Objective:    General: Well Developed, well nourished, and in no acute distress.  Neuro: Alert and oriented x3, extra-ocular muscles intact, sensation grossly intact.  HEENT: Normocephalic, atraumatic, pupils equal round reactive to light, neck supple, no masses, no lymphadenopathy, thyroid nonpalpable.  Skin: Warm and dry, no rashes. Cardiac: Regular rate and rhythm, no murmurs rubs or gallops, no lower extremity edema.  Respiratory: Clear to auscultation bilaterally. Not using accessory muscles, speaking in full sentences.  Impression and Recommendations:

## 2013-10-07 ENCOUNTER — Ambulatory Visit (INDEPENDENT_AMBULATORY_CARE_PROVIDER_SITE_OTHER): Payer: Managed Care, Other (non HMO) | Admitting: Sports Medicine

## 2013-10-07 ENCOUNTER — Encounter: Payer: Self-pay | Admitting: *Deleted

## 2013-10-07 VITALS — BP 127/78 | HR 72 | Wt 229.0 lb

## 2013-10-07 DIAGNOSIS — E291 Testicular hypofunction: Secondary | ICD-10-CM

## 2013-10-07 MED ORDER — TESTOSTERONE CYPIONATE 200 MG/ML IM SOLN
200.0000 mg | Freq: Once | INTRAMUSCULAR | Status: AC
  Administered 2013-10-07: 200 mg via INTRAMUSCULAR

## 2013-10-07 NOTE — Progress Notes (Signed)
   Subjective:    Patient ID: Jose Rojas, male    DOB: [birth year], 43 y.o.   MRN: [id number] Testosterone given IM LUOQ. No complications. Beatris Ship, CMA HPI    Review of Systems     Objective:   Physical Exam        Assessment & Plan:

## 2013-10-07 NOTE — Assessment & Plan Note (Signed)
Testosterone injection as above.

## 2013-11-19 ENCOUNTER — Ambulatory Visit: Payer: Self-pay

## 2014-10-10 ENCOUNTER — Other Ambulatory Visit: Payer: Self-pay

## 2014-10-10 DIAGNOSIS — J302 Other seasonal allergic rhinitis: Secondary | ICD-10-CM

## 2014-10-10 MED ORDER — MONTELUKAST SODIUM 10 MG PO TABS: 10.0000 mg | ORAL_TABLET | Freq: Every day | ORAL | Status: DC

## 2014-10-17 ENCOUNTER — Ambulatory Visit (INDEPENDENT_AMBULATORY_CARE_PROVIDER_SITE_OTHER): Payer: Managed Care, Other (non HMO) | Admitting: Sports Medicine

## 2014-10-17 ENCOUNTER — Encounter: Payer: Self-pay | Admitting: Sports Medicine

## 2014-10-17 VITALS — BP 126/80 | HR 70 | Ht 69.0 in | Wt 214.0 lb

## 2014-10-17 DIAGNOSIS — J4520 Mild intermittent asthma, uncomplicated: Secondary | ICD-10-CM

## 2014-10-17 DIAGNOSIS — E291 Testicular hypofunction: Secondary | ICD-10-CM

## 2014-10-17 DIAGNOSIS — J302 Other seasonal allergic rhinitis: Secondary | ICD-10-CM | POA: Diagnosis not present

## 2014-10-17 DIAGNOSIS — Z0000 Encounter for general adult medical examination without abnormal findings: Secondary | ICD-10-CM | POA: Diagnosis not present

## 2014-10-17 LAB — LIPID PANEL
Cholesterol: 159 mg/dL (ref 0–200)
HDL: 36 mg/dL — ABNORMAL LOW (ref >=40)
LDL Cholesterol: 108 mg/dL — ABNORMAL HIGH (ref 0–99)
Total CHOL/HDL Ratio: 4.4 Ratio
Triglycerides: 73 mg/dL (ref <150)
VLDL: 15 mg/dL (ref 0–40)

## 2014-10-17 LAB — COMPREHENSIVE METABOLIC PANEL
ALT: 22 U/L (ref 0–53)
Albumin: 4.6 g/dL (ref 3.5–5.2)
BUN: 7 mg/dL (ref 6–23)
CO2: 28 mEq/L (ref 19–32)
Calcium: 9.7 mg/dL (ref 8.4–10.5)
Chloride: 106 mEq/L (ref 96–112)
Creat: 0.89 mg/dL (ref 0.50–1.35)
Glucose, Bld: 96 mg/dL (ref 70–99)
Potassium: 4.3 mEq/L (ref 3.5–5.3)
Sodium: 141 mEq/L (ref 135–145)

## 2014-10-17 LAB — HEMOGLOBIN A1C
Hgb A1c MFr Bld: 5.5 % (ref <5.7)
Mean Plasma Glucose: 111 mg/dL (ref <117)

## 2014-10-17 LAB — CBC
HCT: 45.7 % (ref 39.0–52.0)
Hemoglobin: 15.3 g/dL (ref 13.0–17.0)
MCH: 28 pg (ref 26.0–34.0)
MCHC: 33.5 g/dL (ref 30.0–36.0)
MCV: 83.5 fL (ref 78.0–100.0)
MPV: 11 fL (ref 8.6–12.4)
Platelets: 163 10*3/uL (ref 150–400)
RBC: 5.47 MIL/uL (ref 4.22–5.81)
RDW: 16 % — ABNORMAL HIGH (ref 11.5–15.5)
WBC: 5.6 K/uL (ref 4.0–10.5)

## 2014-10-17 LAB — TSH: TSH: 1.76 u[IU]/mL (ref 0.350–4.500)

## 2014-10-17 LAB — COMPREHENSIVE METABOLIC PANEL WITH GFR
AST: 18 U/L (ref 0–37)
Alkaline Phosphatase: 65 U/L (ref 39–117)
Total Bilirubin: 1 mg/dL (ref 0.2–1.2)
Total Protein: 7.7 g/dL (ref 6.0–8.3)

## 2014-10-17 MED ORDER — FLUTICASONE PROPIONATE 50 MCG/ACT NA SUSP: NASAL | Status: DC

## 2014-10-17 MED ORDER — MONTELUKAST SODIUM 10 MG PO TABS: 10.0000 mg | ORAL_TABLET | Freq: Every day | ORAL | Status: DC

## 2014-10-17 MED ORDER — ALBUTEROL SULFATE HFA 108 (90 BASE) MCG/ACT IN AERS: 2.0000 | INHALATION_SPRAY | RESPIRATORY_TRACT | Status: DC | PRN

## 2014-10-17 NOTE — Assessment & Plan Note (Signed)
Checking routine blood work, patient has a follow-up appointment for physical.

## 2014-10-17 NOTE — Assessment & Plan Note (Signed)
Adding Flonase.

## 2014-10-17 NOTE — Progress Notes (Signed)
  Subjective:    CC: Follow-up  HPI: Seasonal allergies: Doing well with Claritin-D, he is amenable to add Flonase, symptoms are predominantly rhinitis related.  Hypogonadism: Patient has since stopped his testosterone injections, he thought his blood pressure was increasing. He does feel significantly more fatigued since coming off of testosterone.  Preventive measures: Desires routine blood work, has a physical coming up.  Past medical history, Surgical history, Family history not pertinant except as noted below, Social history, Allergies, and medications have been entered into the medical record, reviewed, and no changes needed.   Review of Systems: No fevers, chills, night sweats, weight loss, chest pain, or shortness of breath.   Objective:    General: Well Developed, well nourished, and in no acute distress.  Neuro: Alert and oriented x3, extra-ocular muscles intact, sensation grossly intact.  HEENT: Normocephalic, atraumatic, pupils equal round reactive to light, neck supple, no masses, no lymphadenopathy, thyroid nonpalpable.  Skin: Warm and dry, no rashes. Cardiac: Regular rate and rhythm, no murmurs rubs or gallops, no lower extremity edema.  Respiratory: Clear to auscultation bilaterally. Not using accessory muscles, speaking in full sentences.  Impression and Recommendations:    I spent 25 minutes with this patient, greater than 50% was face-to-face time counseling regarding the above diagnoses as well as discussing the pathophysiology of male hypogonadism.

## 2014-10-17 NOTE — Assessment & Plan Note (Signed)
Has since stopped testosterone injections. His noted blood pressure increasing. He does feel significantly more fatigued, I have asked him to restart, certainly we can augment the dose.

## 2014-10-18 LAB — TESTOSTERONE, FREE, TOTAL, SHBG
Sex Hormone Binding: 29 nmol/L (ref 10–50)
Testosterone, Free: 59.6 pg/mL (ref 47.0–244.0)
Testosterone-% Free: 2.1 % (ref 1.6–2.9)
Testosterone: 283 ng/dL — ABNORMAL LOW (ref 300–890)

## 2014-10-18 LAB — PSA, TOTAL AND FREE
PSA, Free Pct: 44 % (ref >25)
PSA, Free: 0.7 ng/mL
PSA: 1.58 ng/mL (ref <=4.00)

## 2014-10-18 LAB — VITAMIN D 25 HYDROXY (VIT D DEFICIENCY, FRACTURES): Vit D, 25-Hydroxy: 29 ng/mL — ABNORMAL LOW (ref 30–100)

## 2014-10-18 LAB — FOLLICLE STIMULATING HORMONE: FSH: 2.9 m[IU]/mL (ref 1.4–18.1)

## 2014-10-18 LAB — LUTEINIZING HORMONE: LH: 6.3 m[IU]/mL (ref 1.5–9.3)

## 2014-11-08 ENCOUNTER — Ambulatory Visit (INDEPENDENT_AMBULATORY_CARE_PROVIDER_SITE_OTHER): Payer: Managed Care, Other (non HMO) | Admitting: Sports Medicine

## 2014-11-08 ENCOUNTER — Encounter: Payer: Self-pay | Admitting: Sports Medicine

## 2014-11-08 VITALS — BP 134/82 | HR 64 | Ht 69.5 in | Wt 220.0 lb

## 2014-11-08 DIAGNOSIS — H6123 Impacted cerumen, bilateral: Secondary | ICD-10-CM

## 2014-11-08 DIAGNOSIS — E291 Testicular hypofunction: Secondary | ICD-10-CM | POA: Diagnosis not present

## 2014-11-08 DIAGNOSIS — L918 Other hypertrophic disorders of the skin: Secondary | ICD-10-CM | POA: Diagnosis not present

## 2014-11-08 DIAGNOSIS — N529 Male erectile dysfunction, unspecified: Secondary | ICD-10-CM | POA: Insufficient documentation

## 2014-11-08 DIAGNOSIS — Z0000 Encounter for general adult medical examination without abnormal findings: Secondary | ICD-10-CM

## 2014-11-08 DIAGNOSIS — N5201 Erectile dysfunction due to arterial insufficiency: Secondary | ICD-10-CM

## 2014-11-08 DIAGNOSIS — H6120 Impacted cerumen, unspecified ear: Secondary | ICD-10-CM | POA: Insufficient documentation

## 2014-11-08 MED ORDER — SILDENAFIL CITRATE 20 MG PO TABS: 20.0000 mg | ORAL_TABLET | ORAL | Status: DC | PRN

## 2014-11-08 NOTE — Assessment & Plan Note (Signed)
Complete physical as above. We went over blood work. He is up-to-date on screening measures.

## 2014-11-08 NOTE — Progress Notes (Signed)
  Subjective:    CC: Complete physical  HPI: Physical will be performed as below.  Hypogonadism: On further questioning Tibor's main issue is not libido, which predominantly lack of a sufficient erection, as well as duration.  Skin tags: Amenable to have these frozen a future visit.  Past medical history, Surgical history, Family history not pertinant except as noted below, Social history, Allergies, and medications have been entered into the medical record, reviewed, and no changes needed.   Review of Systems: No fevers, chills, night sweats, weight loss, chest pain, or shortness of breath.   Objective:    General: Well Developed, well nourished, and in no acute distress.  Neuro: Alert and oriented x3, extra-ocular muscles intact, sensation grossly intact. Cranial nerves II through XII are intact, motor, sensory, and coordinative functions are all intact. HEENT: Normocephalic, atraumatic, pupils equal round reactive to light, neck supple, no masses, no lymphadenopathy, thyroid nonpalpable. Oropharynx, nasopharynx unremarkable, bilateral cerumen impaction. Skin: Warm and dry, no rashes noted. Multiple skin tags on the neck Cardiac: Regular rate and rhythm, no murmurs rubs or gallops.  Respiratory: Clear to auscultation bilaterally. Not using accessory muscles, speaking in full sentences.  Abdominal: Soft, nontender, nondistended, positive bowel sounds, no masses, no organomegaly.  Musculoskeletal: Shoulder, elbow, wrist, hip, knee, ankle stable, and with full range of motion.  Indication: Cerumen impaction of the ear(s) Medical necessity statement: On physical examination, cerumen impairs clinically significant portions of the external auditory canal, and tympanic membrane. Noted obstructive, copious cerumen that cannot be removed without magnification and instrumentations requiring physician skills Consent: Discussed benefits and risks of procedure and verbal consent obtained Procedure:  Patient was prepped for the procedure. Utilized an otoscope to assess and take note of the ear canal, the tympanic membrane, and the presence, amount, and placement of the cerumen. Gentle water irrigation and soft plastic curette was utilized to remove cerumen.  Post procedure examination: shows cerumen was completely removed. Patient tolerated procedure well. The patient is made aware that they may experience temporary vertigo, temporary hearing loss, and temporary discomfort. If these symptom last for more than 24 hours to call the clinic or proceed to the ED.  Impression and Recommendations:

## 2014-11-08 NOTE — Assessment & Plan Note (Signed)
Multiple of the neck and chest, he will make an appointment to have removal/cryotherapy.

## 2014-11-08 NOTE — Assessment & Plan Note (Signed)
On further questioning Jose Rojas has appropriate sex drive and no fatigue, the principal issue is insufficient duration and quality of erection. For this reason we will not proceed with testosterone supplementation anymore and simply use 5 phosphodiesterase inhibitors.

## 2014-11-08 NOTE — Assessment & Plan Note (Signed)
Bilateral removal of cerumen with curettage and irrigation.

## 2015-11-03 ENCOUNTER — Other Ambulatory Visit: Payer: Self-pay

## 2015-11-03 DIAGNOSIS — J302 Other seasonal allergic rhinitis: Secondary | ICD-10-CM

## 2015-11-03 MED ORDER — MONTELUKAST SODIUM 10 MG PO TABS: 10.0000 mg | ORAL_TABLET | Freq: Every day | ORAL | Status: DC

## 2015-12-25 ENCOUNTER — Encounter: Payer: Self-pay | Admitting: Sports Medicine

## 2015-12-25 ENCOUNTER — Ambulatory Visit (INDEPENDENT_AMBULATORY_CARE_PROVIDER_SITE_OTHER): Payer: Managed Care, Other (non HMO) | Admitting: Sports Medicine

## 2015-12-25 DIAGNOSIS — Z0000 Encounter for general adult medical examination without abnormal findings: Secondary | ICD-10-CM

## 2015-12-25 DIAGNOSIS — G43709 Chronic migraine without aura, not intractable, without status migrainosus: Secondary | ICD-10-CM | POA: Diagnosis not present

## 2015-12-25 DIAGNOSIS — N5201 Erectile dysfunction due to arterial insufficiency: Secondary | ICD-10-CM | POA: Diagnosis not present

## 2015-12-25 DIAGNOSIS — H918X9 Other specified hearing loss, unspecified ear: Secondary | ICD-10-CM | POA: Diagnosis not present

## 2015-12-25 DIAGNOSIS — H6120 Impacted cerumen, unspecified ear: Secondary | ICD-10-CM | POA: Diagnosis not present

## 2015-12-25 DIAGNOSIS — G43909 Migraine, unspecified, not intractable, without status migrainosus: Secondary | ICD-10-CM | POA: Insufficient documentation

## 2015-12-25 DIAGNOSIS — E669 Obesity, unspecified: Secondary | ICD-10-CM | POA: Insufficient documentation

## 2015-12-25 DIAGNOSIS — H6123 Impacted cerumen, bilateral: Secondary | ICD-10-CM

## 2015-12-25 LAB — CBC
HCT: 42.6 % (ref 38.5–50.0)
Hemoglobin: 14.6 g/dL (ref 13.2–17.1)
MCH: 28.6 pg (ref 27.0–33.0)
MCHC: 34.3 g/dL (ref 32.0–36.0)
MCV: 83.5 fL (ref 80.0–100.0)
MPV: 10.5 fL (ref 7.5–12.5)
Platelets: 166 10*3/uL (ref 140–400)
RBC: 5.1 MIL/uL (ref 4.20–5.80)
RDW: 15.7 % — ABNORMAL HIGH (ref 11.0–15.0)
WBC: 5.7 K/uL (ref 3.8–10.8)

## 2015-12-25 LAB — HEMOGLOBIN A1C
Hgb A1c MFr Bld: 5.1 % (ref <5.7)
Mean Plasma Glucose: 100 mg/dL

## 2015-12-25 LAB — LIPID PANEL
Cholesterol: 159 mg/dL (ref 125–200)
HDL: 42 mg/dL (ref >=40)
LDL Cholesterol: 106 mg/dL (ref <130)
Total CHOL/HDL Ratio: 3.8 Ratio (ref <=5.0)
Triglycerides: 57 mg/dL (ref <150)
VLDL: 11 mg/dL (ref <30)

## 2015-12-25 LAB — COMPREHENSIVE METABOLIC PANEL
ALT: 14 U/L (ref 9–46)
AST: 15 U/L (ref 10–40)
Alkaline Phosphatase: 69 U/L (ref 40–115)
BUN: 8 mg/dL (ref 7–25)
Calcium: 9.3 mg/dL (ref 8.6–10.3)
Chloride: 107 mmol/L (ref 98–110)
Potassium: 4.1 mmol/L (ref 3.5–5.3)
Sodium: 141 mmol/L (ref 135–146)
Total Protein: 7 g/dL (ref 6.1–8.1)

## 2015-12-25 LAB — COMPREHENSIVE METABOLIC PANEL WITH GFR
Albumin: 4.6 g/dL (ref 3.6–5.1)
CO2: 25 mmol/L (ref 20–31)
Creat: 0.93 mg/dL (ref 0.60–1.35)
Glucose, Bld: 92 mg/dL (ref 65–99)
Total Bilirubin: 0.9 mg/dL (ref 0.2–1.2)

## 2015-12-25 LAB — TSH: TSH: 1.46 mIU/L (ref 0.40–4.50)

## 2015-12-25 LAB — HIV ANTIBODY (ROUTINE TESTING W REFLEX): HIV 1&2 Ab, 4th Generation: NONREACTIVE

## 2015-12-25 NOTE — Assessment & Plan Note (Signed)
Patient will try to work on weight loss himself before considering pharmacologic intervention. Return in 3 months to recheck weight.

## 2015-12-25 NOTE — Progress Notes (Signed)
  Subjective:    CC: Complete physical exam   HPI: 45 yo M with history of ED, migraines, presenting for complete physical exam.  He has been feeling well since last exam one year ago. He recently began trying to lose weight because he is worried about his health.  He is trying to cut out sugar beverages and sweets.  His wife is a Risk manager and is supporting him in his weight loss journey.  He is not interested in talking about weight loss supplements at this time.  He also mentions that his urine stream has been weaker than normal over the past few months.  He denies any straining or pain with urination, but does mention that he has some dribbling at the end of his stream.  He does not wake up in the middle of the night to urinate.  His ED has been well controlled since starting Viagra last year - no complaints at this time.  He says he gets a migraine every once and a while but is not interested in starting medication for migraines at this time. He does not smoke.   Past medical history, Surgical history, Family history not pertinant except as noted below, Social history, Allergies, and medications have been entered into the medical record, reviewed, and no changes needed.   Review of Systems: No headache, visual changes, nausea, vomiting, diarrhea, constipation, dizziness, abdominal pain, skin rash, fevers, chills, night sweats, swollen lymph nodes, weight loss, chest pain, body aches, joint swelling, muscle aches, shortness of breath, mood changes, visual or auditory hallucinations.  Objective:    General: Well Developed, well nourished, and in no acute distress.  Neuro: Alert and oriented x3, extra-ocular muscles intact, sensation grossly intact.  HEENT: Normocephalic, atraumatic, pupils equal round reactive to light, neck supple, no masses, no lymphadenopathy, thyroid nonpalpable.   Skin: Warm and dry, no rashes noted.  Cardiac: Regular rate and rhythm, no murmurs rubs or gallops.    Respiratory: Clear to auscultation bilaterally. Not using accessory muscles, speaking in full sentences.  Abdominal: Soft, nontender, nondistended, positive bowel sounds, no masses, no organomegaly.  Musculoskeletal: Shoulder, elbow, wrist, hip, knee, ankle stable, and with full range of motion.   Impression and Recommendations:    The patient was counselled, risk factors were discussed, anticipatory guidance given.  1. Weight loss: currently 226 pounds  -He is working on losing weight, currently using lifestyle modification (walking 4 times per week, cutting out sugar beverages, avoiding sweets). His goal is to lose 1.5-2 lbs per week.  He is not interested in phentermine at this time. -Will follow-up in 3 months for weight re-check and will discuss medication at that time if he is having trouble losing weight.   2. Weak urine stream -Likely from enlarged prostate.  He denies straining, nocturia, or dysuria.  He is not interested in Flomax at this time.   3. Migraines -States he gets a migraine once every few months.  Is not interested in starting medications at this time.   4. ED -Has been on Viagra for the past year, with good resolution of his ED symptoms.  No concerns at this time.    5. Preventative health measures -Checking Hb A1c, TSH, lipids, CBC, CMP, Vit D, HIV

## 2015-12-25 NOTE — Assessment & Plan Note (Signed)
Rare, does not desire abortive or preventative treatment.

## 2015-12-25 NOTE — Assessment & Plan Note (Addendum)
Annual physical as above, routine blood work ordered.

## 2015-12-25 NOTE — Progress Notes (Deleted)
  Subjective:    CC: Complete physical exam   HPI: 45 yo M with history of ED, migraines, presenting for complete physical exam.  He has been feeling well since last exam one year ago. He recently began trying to lose weight because he is worried about his health.  He is trying to cut out sugar beverages and sweets.  His wife is a Risk manager and is supporting him in his weight loss journey.  He is not interested in talking about weight loss supplements at this time.  He also mentions that his urine stream has been weaker than normal over the past few months.  He denies any straining or pain with urination, but does mention that he has some dribbling at the end of his stream.  He does not wake up in the middle of the night to urinate.  His ED has been well controlled since starting Viagra last year - no complaints at this time.  He says he gets a migraine every once and a while but is not interested in starting medication for migraines at this time. He does not smoke.   Past medical history, Surgical history, Family history not pertinant except as noted below, Social history, Allergies, and medications have been entered into the medical record, reviewed, and no changes needed.   Review of Systems: No headache, visual changes, nausea, vomiting, diarrhea, constipation, dizziness, abdominal pain, skin rash, fevers, chills, night sweats, swollen lymph nodes, weight loss, chest pain, body aches, joint swelling, muscle aches, shortness of breath, mood changes, visual or auditory hallucinations.  Objective:    General: Well Developed, well nourished, and in no acute distress.  Neuro: Alert and oriented x3, extra-ocular muscles intact, sensation grossly intact.  HEENT: Normocephalic, atraumatic, pupils equal round reactive to light, neck supple, no masses, no lymphadenopathy, thyroid nonpalpable.   Skin: Warm and dry, no rashes noted.  Cardiac: Regular rate and rhythm, no murmurs rubs or gallops.    Respiratory: Clear to auscultation bilaterally. Not using accessory muscles, speaking in full sentences.  Abdominal: Soft, nontender, nondistended, positive bowel sounds, no masses, no organomegaly.  Musculoskeletal: Shoulder, elbow, wrist, hip, knee, ankle stable, and with full range of motion.   Impression and Recommendations:    The patient was counselled, risk factors were discussed, anticipatory guidance given.  1. Weight loss: currently 226 pounds  -He is working on losing weight, currently using lifestyle modification (walking 4 times per week, cutting out sugar beverages, avoiding sweets). His goal is to lose 1.5-2 lbs per week.  He is not interested in phentermine at this time. -Will follow-up in 3 months for weight re-check and will discuss medication at that time if he is having trouble losing weight.   2. Weak urine stream -Likely from enlarged prostate.  He denies straining, nocturia, or dysuria.  He is not interested in Flomax at this time.   3. Migraines -States he gets a migraine once every few months.  Is not interested in starting medications at this time.   4. ED -Has been on Viagra for the past year, with good resolution of his ED symptoms.  No concerns at this time.    5. Preventative health measures -Checking Hb A1c, TSH, lipids, CBC, CMP, Vit D, HIV

## 2015-12-25 NOTE — Assessment & Plan Note (Signed)
Well controlled, no changes 

## 2015-12-26 LAB — VITAMIN D 25 HYDROXY (VIT D DEFICIENCY, FRACTURES): Vit D, 25-Hydroxy: 29 ng/mL — ABNORMAL LOW (ref 30–100)

## 2015-12-26 MED ORDER — VITAMIN D (ERGOCALCIFEROL) 1.25 MG (50000 UNIT) PO CAPS: 50000.0000 [IU] | ORAL_CAPSULE | ORAL | 0 refills | Status: DC

## 2015-12-26 NOTE — Addendum Note (Signed)
Addended by: Silverio Decamp on: 12/26/2015 08:37 AM   Modules accepted: Orders

## 2016-03-26 ENCOUNTER — Ambulatory Visit: Payer: Self-pay | Admitting: Sports Medicine

## 2016-06-03 ENCOUNTER — Encounter: Payer: Self-pay | Admitting: *Deleted

## 2016-06-03 ENCOUNTER — Emergency Department
Admission: EM | Admit: 2016-06-03 | Discharge: 2016-06-03 | Disposition: A | Discharge status: Home / Self Care | Payer: Managed Care, Other (non HMO) | Source: Home / Self Care | Attending: Family Medicine | Admitting: Family Medicine

## 2016-06-03 DIAGNOSIS — R519 Headache, unspecified: Secondary | ICD-10-CM

## 2016-06-03 DIAGNOSIS — R51 Headache: Secondary | ICD-10-CM | POA: Diagnosis not present

## 2016-06-03 DIAGNOSIS — R030 Elevated blood-pressure reading, without diagnosis of hypertension: Secondary | ICD-10-CM

## 2016-06-03 NOTE — Discharge Instructions (Signed)
Check blood pressure daily and record on a calendar.  Minimize salt intake.  Followup with family doctor if blood pressure remains elevated.

## 2016-06-03 NOTE — ED Triage Notes (Signed)
Pt c/o HA and elevated BP today. BP at home was 158/108 per patient.

## 2016-06-03 NOTE — ED Provider Notes (Signed)
Jose Rojas CARE    CSN: [id number] Arrival date & time: 06/03/16  1417     History   Chief Complaint Chief Complaint  Patient presents with  . Hypertension  . Headache    HPI Jose Rojas is a 46 y.o. male.   Patient complains of onset of headache last night, still present this morning.  He had migraines as a child, and his headaches usually improve after taking Aleve.  He was concerned when he measured his BP this morning to be 158/108.  He states that his BP is usually about 130/87, and recently it was normal at work.  He has no localizing neurologic symptoms. He has a family history of hypertension:  mother   The history is provided by the patient.    History reviewed. No pertinent past medical history.  Patient Active Problem List   Diagnosis Date Noted  . Migraine headache 12/25/2015  . Obesity 12/25/2015  . Erectile dysfunction 11/08/2014  . 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 Asthma, chronic 07/15/2012  . 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 Annual physical exam 07/09/2012  . Seasonal allergies 07/09/2012    History reviewed. No pertinent surgical history.     Home Medications    Prior to Admission medications   Medication Sig Start Date End Date Taking? Authorizing Provider  albuterol (PROVENTIL HFA;VENTOLIN HFA) 108 (90 BASE) MCG/ACT inhaler Inhale 2 puffs into the lungs every 4 (four) hours as needed for wheezing or shortness of breath. Patient not taking: Reported on 11/08/2014 10/17/14   Silverio Decamp, MD  montelukast (SINGULAIR) 10 MG tablet Take 1 tablet (10 mg total) by mouth at bedtime. 11/03/15   Silverio Decamp, MD  Multiple Vitamin (MULTIVITAMIN) capsule Take 1 capsule by mouth daily.    Historical Provider, MD  sildenafil (REVATIO) 20 MG tablet Take 1 tablet (20 mg total) by mouth as needed. 11/08/14   Silverio Decamp, MD  Vitamin D, Ergocalciferol, (DRISDOL) 50000 units CAPS capsule Take 1 capsule (50,000 Units total)  by mouth every 7 (seven) days. Take for 8 total doses(weeks) 12/26/15   Silverio Decamp, MD    Family History Family History  Problem Relation Age of Onset  . Hypertension      parent    Social History Social History  Substance Use Topics  . Smoking status: Former Research scientist (life sciences)  . Smokeless tobacco: Never Used  . Alcohol use No     Allergies   Patient has no known allergies.   Review of Systems Review of Systems  Constitutional: Negative for activity change, appetite change, chills, diaphoresis, fatigue and fever.  HENT: Negative for congestion, ear pain, facial swelling, sinus pain, sinus pressure, sore throat and trouble swallowing.   Eyes: Negative for photophobia and visual disturbance.  Respiratory: Negative for shortness of breath.   Cardiovascular: Negative for chest pain, palpitations and leg swelling.  Gastrointestinal: Negative for nausea and vomiting.  Genitourinary: Negative.   Musculoskeletal: Negative.   Skin: Negative.   Neurological: Positive for headaches. Negative for dizziness, tremors, seizures, syncope, facial asymmetry, speech difficulty, weakness, light-headedness and numbness.  Psychiatric/Behavioral: Negative for confusion.     Physical Exam Triage Vital Signs ED Triage Vitals  Enc Vitals Group     BP      Pulse      Resp      Temp      Temp src      SpO2      Weight      Height  Head Circumference      Peak Flow      Pain Score      Pain Loc      Pain Edu?      Excl. in [place]?    No data found.   Updated Vital Signs BP 157/96 (BP Location: Left Arm)   Pulse 68   Temp 98 F (36.7 C) (Oral)   Resp 18   Ht 5' 8.5" (1.74 m)   Wt 238 lb (108 kg)   SpO2 99%   BMI 35.66 kg/m   Visual Acuity Right Eye Distance:   Left Eye Distance:   Bilateral Distance:    Right Eye Near:   Left Eye Near:    Bilateral Near:     Physical Exam Nursing notes and Vital Signs reviewed. Appearance:  Patient appears stated age, and in no  acute distress Eyes:  Pupils are equal, round, and reactive to light and accomodation.  Extraocular movement is intact.  Conjunctivae are not inflamed.  Fundi benign, no photophobia.  Ears:  Canals normal.  Tympanic membranes normal.  Nose:   Normal turbinates.  No sinus tenderness.    Pharynx:  Normal Neck:  Supple.  No adenopathy or thyromegaly. Lungs:  Clear to auscultation.  Breath sounds are equal.  Moving air well. Heart:  Regular rate and rhythm without murmurs, rubs, or gallops.  Abdomen:  Nontender without masses or hepatosplenomegaly.  Bowel sounds are present.  No CVA or flank tenderness.  Extremities:  No edema.  Skin:  No rash present.  Neurologic:  Cranial nerves 2 through 12 are normal.  Patellar, achilles, and elbow reflexes are normal.  Cerebellar function is intact (finger-to-nose and rapid alternating hand movement).  Gait and station are normal.   UC Treatments / Results  Labs (all labs ordered are listed, but only abnormal results are displayed) Labs Reviewed - No data to display  EKG  EKG Interpretation None       Radiology No results found.  Procedures Procedures (including critical care time)  Medications Ordered in UC Medications - No data to display   Initial Impression / Assessment and Plan / UC Course  I have reviewed the triage vital signs and the nursing notes.  Pertinent labs & imaging results that were available during my care of the patient were reviewed by me and considered in my medical decision making (see chart for details).  Clinical Course   Suspect elevated blood pressure today resulting from migraine headache (pain response). Patient reassured Check blood pressure daily and record on a calendar.  Minimize salt intake.  Followup with family doctor if blood pressure remains elevated.      Final Clinical Impressions(s) / UC Diagnoses   Final diagnoses:  Nonintractable episodic headache, unspecified headache type  Elevated blood  pressure reading without diagnosis of hypertension    New Prescriptions New Prescriptions   No medications on file     [name], MD 06/19/16 1421

## 2016-10-07 ENCOUNTER — Encounter: Payer: Self-pay | Admitting: Physician Assistant

## 2016-10-07 ENCOUNTER — Ambulatory Visit (INDEPENDENT_AMBULATORY_CARE_PROVIDER_SITE_OTHER): Payer: Managed Care, Other (non HMO) | Admitting: Physician Assistant

## 2016-10-07 ENCOUNTER — Ambulatory Visit (INDEPENDENT_AMBULATORY_CARE_PROVIDER_SITE_OTHER): Payer: Managed Care, Other (non HMO)

## 2016-10-07 VITALS — BP 137/84 | HR 74 | Wt 237.0 lb

## 2016-10-07 DIAGNOSIS — R109 Unspecified abdominal pain: Secondary | ICD-10-CM

## 2016-10-07 DIAGNOSIS — Z87442 Personal history of urinary calculi: Secondary | ICD-10-CM

## 2016-10-07 DIAGNOSIS — R319 Hematuria, unspecified: Secondary | ICD-10-CM | POA: Diagnosis not present

## 2016-10-07 LAB — POCT URINALYSIS DIPSTICK
Bilirubin, UA: NEGATIVE
Glucose, UA: NEGATIVE
KETONES UA: NEGATIVE
Leukocytes, UA: NEGATIVE
Nitrite, UA: NEGATIVE
PH UA: 7 (ref 5.0–8.0)
PROTEIN UA: NEGATIVE
SPEC GRAV UA: 1.015 (ref 1.010–1.025)
Urobilinogen, UA: 0.2 E.U./dL

## 2016-10-07 NOTE — Progress Notes (Signed)
HPI:                                                                Jose Rojas is a 46 y.o. male who presents to [place]: [place] today for "possible kidney stone"  Patient with PMH of nephrolithiasis, hypogonadism, migraine, asthma and obesity presents with concerns about a possible kidney stone recurrence. Patient reports on Thursday he had left-sided flank pain and hematuria. States flank pain was moderate, waxing and waning and resolved on its own by Friday morning. States hematuria persisted intermittently and he passed two clots on Saturday. Today he is asymptomatic. Patient expresses concern today that his symptoms could be bladder cancer. Denies fever, chills, night sweats, or weight loss.   Past Medical History:  Diagnosis Date  . Allergy   . Asthma   . Calcium nephrolithiasis   . Hypogonadism in male   . Obesity    Past Surgical History:  Procedure Laterality Date  . NO PAST SURGERIES     Social History  Substance Use Topics  . Smoking status: Former Research scientist (life sciences)  . Smokeless tobacco: Never Used  . Alcohol use No   family history is not on file.  ROS: negative except as noted in the HPI  Medications: Current Outpatient Prescriptions  Medication Sig Dispense Refill  . montelukast (SINGULAIR) 10 MG tablet Take 1 tablet (10 mg total) by mouth at bedtime. 30 tablet 6  . Multiple Vitamin (MULTIVITAMIN) capsule Take 1 capsule by mouth daily.    . sildenafil (REVATIO) 20 MG tablet Take 1 tablet (20 mg total) by mouth as needed. 50 tablet 11   No current facility-administered medications for this visit.    No Known Allergies     Objective:  BP 137/84   Pulse 74   Wt 237 lb (107.5 kg)   BMI 35.51 kg/m  Gen: well-groomed, cooperative, not ill-appearing, no distress Pulm: Normal work of breathing, normal phonation GI: abdomen obese, soft, nontender, nondistended Neuro: alert and oriented x 3, EOM's intact, no  tremor MSK: moving all extremities, normal gait and station, no peripheral edema Psych: good eye contact, normal affect, euthymic mood, normal speech and thought content    Results for orders placed or performed in visit on 10/07/16 (from the past 72 hour(s))  POCT Urinalysis Dipstick     Status: Abnormal   Collection Time: 10/07/16  8:20 AM  Result Value Ref Range   Color, UA yellow    Clarity, UA clear    Glucose, UA negative    Bilirubin, UA negative    Ketones, UA negative    Spec Grav, UA 1.015 1.010 - 1.025   Blood, UA moderate    pH, UA 7.0 5.0 - 8.0   Protein, UA negative    Urobilinogen, UA 0.2 0.2 or 1.0 E.U./dL   Nitrite, UA negative    Leukocytes, UA Negative Negative   No results found.    Assessment and Plan: 46 y.o. male with   1. Hematuria - POCT Urinalysis Dipstick ++ for moderate blood likely 2/2 to trauma from stone passage - DG Abd 1 View to assess for any additional stones  2. History of nephrolithiasis - reassurance provided that patient's symptoms are most consistent with nephrolithiasis. Advised  if hematuria persists, to return. - counseled on dietary guidelines to prevent stone formation, though advised stones could still recur  Patient education and anticipatory guidance given Patient agrees with treatment plan Follow-up as needed if symptoms worsen or fail to improve  Darlyne Russian PA-C

## 2016-10-07 NOTE — Patient Instructions (Addendum)
- Go downstairs for X-ray of your abdomen to look for any additional stone - Follow-up with Dr. Darene Lamer in July for your annual physical - Return if symptoms worsen - Increase your daily fluid intake - Limit animal protein in your diet. Increase fruits and vegetables. - Limit foods high in oxalate (spinach, rhubarb, potatoes, peanuts, cashews, almonds) - Limit salt - Avoid vitamin C and calcium supplements - Continue to eat a normal calcium diet  Dietary Guidelines to Help Prevent Kidney Stones Kidney stones are deposits of minerals and salts that form inside your kidneys. Your risk of developing kidney stones may be greater depending on your diet, your lifestyle, the medicines you take, and whether you have certain medical conditions. Most people can reduce their chances of developing kidney stones by following the instructions below. Depending on your overall health and the type of kidney stones you tend to develop, your dietitian may give you more specific instructions. What are tips for following this plan? Reading food labels   Choose foods with "no salt added" or "low-salt" labels. Limit your sodium intake to less than 1500 mg per day.  Choose foods with calcium for each meal and snack. Try to eat about 300 mg of calcium at each meal. Foods that contain 200-500 mg of calcium per serving include:  8 oz (237 ml) of milk, fortified nondairy milk, and fortified fruit juice.  8 oz (237 ml) of kefir, yogurt, and soy yogurt.  4 oz (118 ml) of tofu.  1 oz of cheese.  1 cup (300 g) of dried figs.  1 cup (91 g) of cooked broccoli.  1-3 oz can of sardines or mackerel.  Most people need 1000 to 1500 mg of calcium each day. Talk to your dietitian about how much calcium is recommended for you. Shopping   Buy plenty of fresh fruits and vegetables. Most people do not need to avoid fruits and vegetables, even if they contain nutrients that may contribute to kidney stones.  When shopping for  convenience foods, choose:  Whole pieces of fruit.  Premade salads with dressing on the side.  Low-fat fruit and yogurt smoothies.  Avoid buying frozen meals or prepared deli foods.  Look for foods with live cultures, such as yogurt and kefir. Cooking   Do not add salt to food when cooking. Place a salt shaker on the table and allow each person to add his or her own salt to taste.  Use vegetable protein, such as beans, textured vegetable protein (TVP), or tofu instead of meat in pasta, casseroles, and soups. Meal planning   Eat less salt, if told by your dietitian. To do this:  Avoid eating processed or premade food.  Avoid eating fast food.  Eat less animal protein, including cheese, meat, poultry, or fish, if told by your dietitian. To do this:  Limit the number of times you have meat, poultry, fish, or cheese each week. Eat a diet free of meat at least 2 days a week.  Eat only one serving each day of meat, poultry, fish, or seafood.  When you prepare animal protein, cut pieces into small portion sizes. For most meat and fish, one serving is about the size of one deck of cards.  Eat at least 5 servings of fresh fruits and vegetables each day. To do this:  Keep fruits and vegetables on hand for snacks.  Eat 1 piece of fruit or a handful of berries with breakfast.  Have a salad and fruit at  lunch.  Have two kinds of vegetables at dinner.  Limit foods that are high in a substance called oxalate. These include:  Spinach.  Rhubarb.  Beets.  Potato chips and french fries.  Nuts.  If you regularly take a diuretic medicine, make sure to eat at least 1-2 fruits or vegetables high in potassium each day. These include:  Avocado.  Banana.  Orange, prune, carrot, or tomato juice.  Baked potato.  Cabbage.  Beans and split peas. General instructions   Drink enough fluid to keep your urine clear or pale yellow. This is the most important thing you can  do.  Talk to your health care provider and dietitian about taking daily supplements. Depending on your health and the cause of your kidney stones, you may be advised:  Not to take supplements with vitamin C.  To take a calcium supplement.  To take a daily probiotic supplement.  To take other supplements such as magnesium, fish oil, or vitamin B6.  Take all medicines and supplements as told by your health care provider.  Limit alcohol intake to no more than 1 drink a day for nonpregnant women and 2 drinks a day for men. One drink equals 12 oz of beer, 5 oz of wine, or 1 oz of hard liquor.  Lose weight if told by your health care provider. Work with your dietitian to find strategies and an eating plan that works best for you. What foods are not recommended? Limit your intake of the following foods, or as told by your dietitian. Talk to your dietitian about specific foods you should avoid based on the type of kidney stones and your overall health. Grains  Breads. Bagels. Rolls. Baked goods. Salted crackers. Cereal. Pasta. Vegetables  Spinach. Rhubarb. Beets. Canned vegetables. Angie Fava. Olives. Meats and other protein foods  Nuts. Nut butters. Large portions of meat, poultry, or fish. Salted or cured meats. Deli meats. Hot dogs. Sausages. Dairy  Cheese. Beverages  Regular soft drinks. Regular vegetable juice. Seasonings and other foods  Seasoning blends with salt. Salad dressings. Canned soups. Soy sauce. Ketchup. Barbecue sauce. Canned pasta sauce. Casseroles. Pizza. Lasagna. Frozen meals. Potato chips. [place] fries. Summary  You can reduce your risk of kidney stones by making changes to your diet.  The most important thing you can do is drink enough fluid. You should drink enough fluid to keep your urine clear or pale yellow.  Ask your health care provider or dietitian how much protein from animal sources you should eat each day, and also how much salt and calcium you should have  each day. This information is not intended to replace advice given to you by your health care provider. Make sure you discuss any questions you have with your health care provider. Document Released: 09/14/2010 Document Revised: 04/30/2016 Document Reviewed: 04/30/2016 Elsevier Interactive Patient Education  2017 [employer].

## 2016-10-09 LAB — CBC AND DIFFERENTIAL
HCT: 47 (ref 41–53)
Hemoglobin: 15 (ref 13.5–17.5)
Platelets: 190 (ref 150–399)
WBC: 7.7

## 2016-10-09 LAB — HEPATIC FUNCTION PANEL
ALT: 22 (ref 10–40)
AST: 19 (ref 14–40)
Alkaline Phosphatase: 85 (ref 25–125)
Bilirubin, Total: 0.7

## 2016-10-09 LAB — TSH: TSH: 2.94 (ref 0.41–5.90)

## 2016-10-09 LAB — PSA: PSA: 1.33

## 2016-10-09 LAB — LIPID PANEL
CHOLESTEROL: 204 — AB (ref 0–200)
HDL: 40 (ref 35–70)
LDL CALC: 144
TRIGLYCERIDES: 100 (ref 40–160)

## 2016-10-09 LAB — BASIC METABOLIC PANEL
BUN: 10 (ref 4–21)
CREATININE: 1 (ref 0.6–1.3)
Glucose: 105

## 2016-10-09 LAB — HEMOGLOBIN A1C: Hemoglobin A1C: 5.5

## 2017-03-20 ENCOUNTER — Other Ambulatory Visit: Payer: Self-pay | Admitting: Sports Medicine

## 2017-03-20 DIAGNOSIS — J302 Other seasonal allergic rhinitis: Secondary | ICD-10-CM

## 2017-03-28 ENCOUNTER — Ambulatory Visit (INDEPENDENT_AMBULATORY_CARE_PROVIDER_SITE_OTHER): Payer: Managed Care, Other (non HMO) | Admitting: Sports Medicine

## 2017-03-28 ENCOUNTER — Encounter: Payer: Self-pay | Admitting: Sports Medicine

## 2017-03-28 VITALS — BP 156/91 | HR 67 | Ht 68.5 in | Wt 239.0 lb

## 2017-03-28 DIAGNOSIS — Z0000 Encounter for general adult medical examination without abnormal findings: Secondary | ICD-10-CM

## 2017-03-28 DIAGNOSIS — Z23 Encounter for immunization: Secondary | ICD-10-CM | POA: Diagnosis not present

## 2017-03-28 DIAGNOSIS — E6609 Other obesity due to excess calories: Secondary | ICD-10-CM

## 2017-03-28 DIAGNOSIS — E785 Hyperlipidemia, unspecified: Secondary | ICD-10-CM

## 2017-03-28 DIAGNOSIS — R3129 Other microscopic hematuria: Secondary | ICD-10-CM | POA: Diagnosis not present

## 2017-03-28 DIAGNOSIS — I10 Essential (primary) hypertension: Secondary | ICD-10-CM | POA: Diagnosis not present

## 2017-03-28 LAB — POCT URINALYSIS DIPSTICK
Bilirubin, UA: NEGATIVE
Blood, UA: NEGATIVE
Glucose, UA: NEGATIVE
Ketones, UA: NEGATIVE
Leukocytes, UA: NEGATIVE
Nitrite, UA: NEGATIVE
Protein, UA: NEGATIVE
Spec Grav, UA: 1.015 (ref 1.010–1.025)
Urobilinogen, UA: 0.2 U/dL
pH, UA: 6.5 (ref 5.0–8.0)

## 2017-03-28 NOTE — Assessment & Plan Note (Signed)
Routine physical, flu shot. Labs already obtained.

## 2017-03-28 NOTE — Assessment & Plan Note (Addendum)
Worsening, weights also increasing, restless sleep. Excessive daytime sleepiness. Adding a home sleep study, he will work on weight loss aggressively. After some nutrition support if he has not improved his blood pressure we will add a medication.

## 2017-03-28 NOTE — Assessment & Plan Note (Addendum)
Urinalysis was clear of blood today.

## 2017-03-28 NOTE — Progress Notes (Signed)
  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  He told me he would lose weight on his own but he failed.  Past medical history:  Negative.  See flowsheet/record as well for more information.  Surgical history: Negative.  See flowsheet/record as well for more information.  Family history: Negative.  See flowsheet/record as well for more information.  Social history: Negative.  See flowsheet/record as well for more information.  Allergies, and medications have been entered into the medical record, reviewed, and no changes needed.    Review of Systems: No headache, visual changes, nausea, vomiting, diarrhea, constipation, dizziness, abdominal pain, skin rash, fevers, chills, night sweats, swollen lymph nodes, weight loss, chest pain, body aches, joint swelling, muscle aches, shortness of breath, mood changes, visual or auditory hallucinations.  Objective:    General: Well Developed, well nourished, and in no acute distress.  Neuro: Alert and oriented x3, extra-ocular muscles intact, sensation grossly intact. Cranial nerves II through XII are intact, motor, sensory, and coordinative functions are all intact. HEENT: Normocephalic, atraumatic, pupils equal round reactive to light, neck supple, no masses, no lymphadenopathy, thyroid nonpalpable. Oropharynx, nasopharynx, external ear canals are unremarkable. Skin: Warm and dry, no rashes noted.  Several skin tags over the neck. Cardiac: Regular rate and rhythm, no murmurs rubs or gallops.  Respiratory: Clear to auscultation bilaterally. Not using accessory muscles, speaking in full sentences.  Abdominal: Soft, nontender, nondistended, positive bowel sounds, no masses, no organomegaly.  Musculoskeletal:  Shoulder, elbow, wrist, hip, knee, ankle stable, and with full range of motion.  Impression and Recommendations:    The patient was counselled, risk factors were discussed, anticipatory guidance given.  Annual physical exam Routine physical, flu shot. Labs already obtained.  Hematuria, microscopic Urinalysis was clear of blood today.  Benign essential hypertension Worsening, weights also increasing, restless sleep. Excessive daytime sleepiness. Adding a home sleep study, he will work on weight loss aggressively. After some nutrition support if he has not improved his blood pressure we will add a medication.   Obesity Referral to nutritionist, patient will return in 51 month, if insufficient weight loss we will consider pharmacologic intervention, I do need his blood pressure controlled before we do phentermine.  Hyperlipidemia LDL goal <100 Working aggressively on weight loss, if insufficient results we will add a statin. Recent LDL was 144, this was done on a job screening.  ___________________________________________ Gwen Her. Dianah Field, M.D., ABFM., CAQSM. Primary Care and [place] Instructor of [place] of [facility] of Medicine

## 2017-03-28 NOTE — Assessment & Plan Note (Signed)
Referral to nutritionist, patient will return in 1 month, if insufficient weight loss we will consider pharmacologic intervention, I do need his blood pressure controlled before we do phentermine.

## 2017-03-28 NOTE — Assessment & Plan Note (Signed)
Working aggressively on weight loss, if insufficient results we will add a statin. Recent LDL was 144, this was done on a job screening.

## 2017-04-28 ENCOUNTER — Ambulatory Visit: Payer: Self-pay | Admitting: Sports Medicine

## 2017-04-29 ENCOUNTER — Other Ambulatory Visit: Payer: Self-pay | Admitting: Sports Medicine

## 2017-04-29 DIAGNOSIS — J302 Other seasonal allergic rhinitis: Secondary | ICD-10-CM

## 2017-05-09 ENCOUNTER — Ambulatory Visit: Payer: Self-pay | Admitting: Sports Medicine

## 2017-05-16 ENCOUNTER — Encounter: Payer: Self-pay | Admitting: Sports Medicine

## 2017-07-04 ENCOUNTER — Ambulatory Visit: Payer: Self-pay | Admitting: Sports Medicine

## 2017-08-29 ENCOUNTER — Ambulatory Visit: Payer: Self-pay | Admitting: Sports Medicine

## 2017-10-04 LAB — LIPID PANEL
Cholesterol: 204 — AB (ref 0–200)
HDL: 39 (ref 35–70)
LDL Cholesterol: 119
Triglycerides: 100 (ref 40–160)

## 2017-10-04 LAB — PSA: PSA: 1.4

## 2017-10-04 LAB — HEPATIC FUNCTION PANEL
ALT: 21 (ref 10–40)
AST: 21 (ref 14–40)
Alkaline Phosphatase: 84 (ref 25–125)
Bilirubin, Total: 0.7

## 2017-10-04 LAB — BASIC METABOLIC PANEL
BUN: 9 (ref 4–21)
Creatinine: 1 (ref 0.6–1.3)
Glucose: 88

## 2017-10-04 LAB — TSH: TSH: 1.56 (ref 0.41–5.90)

## 2018-04-20 IMAGING — DX DG ABDOMEN 1V
2 series · 2 of 2 positions shown · non-contrast
Comparison: None.

CLINICAL DATA: Flank pain and hematuria

EXAM:
ABDOMEN - 1 VIEW

[abdomen kub (1 of 2)]
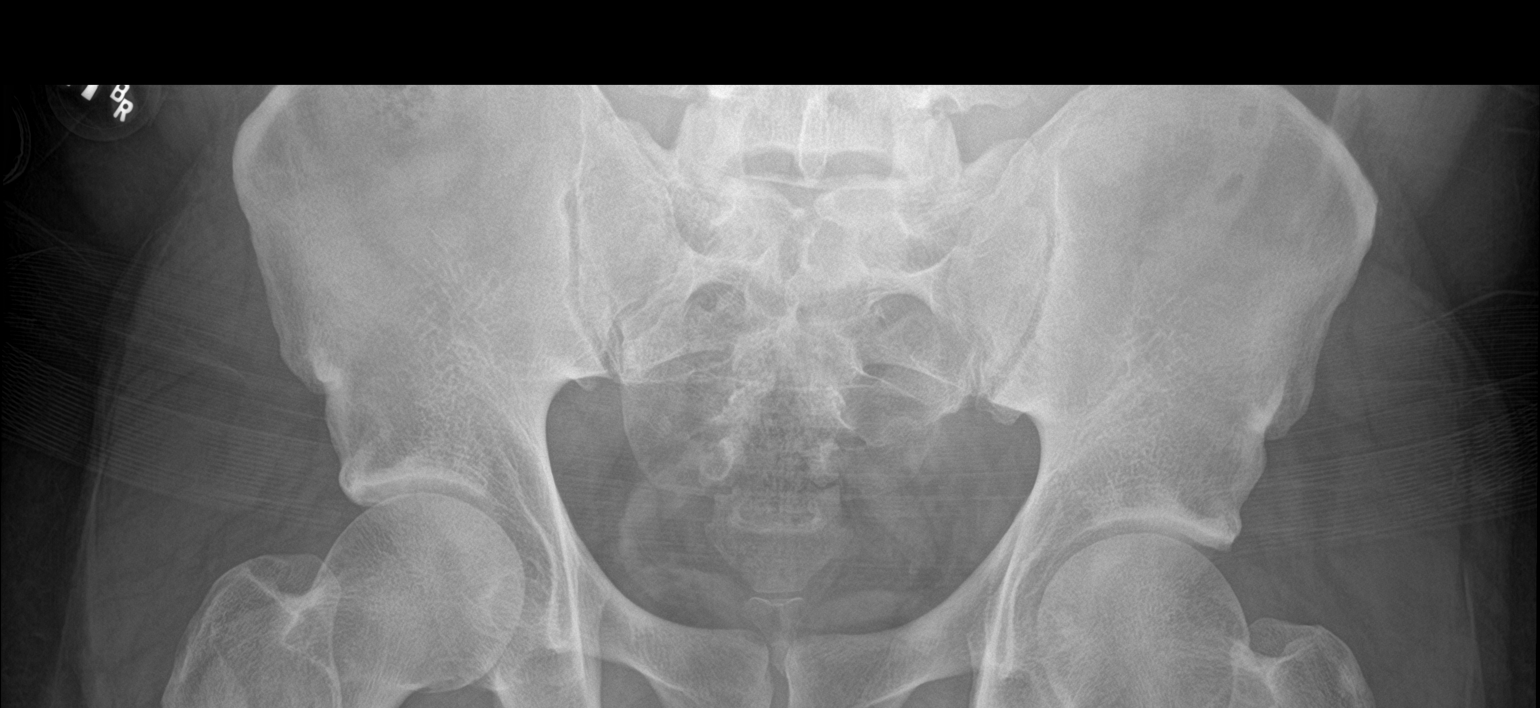

[abdomen kub (2 of 2)]
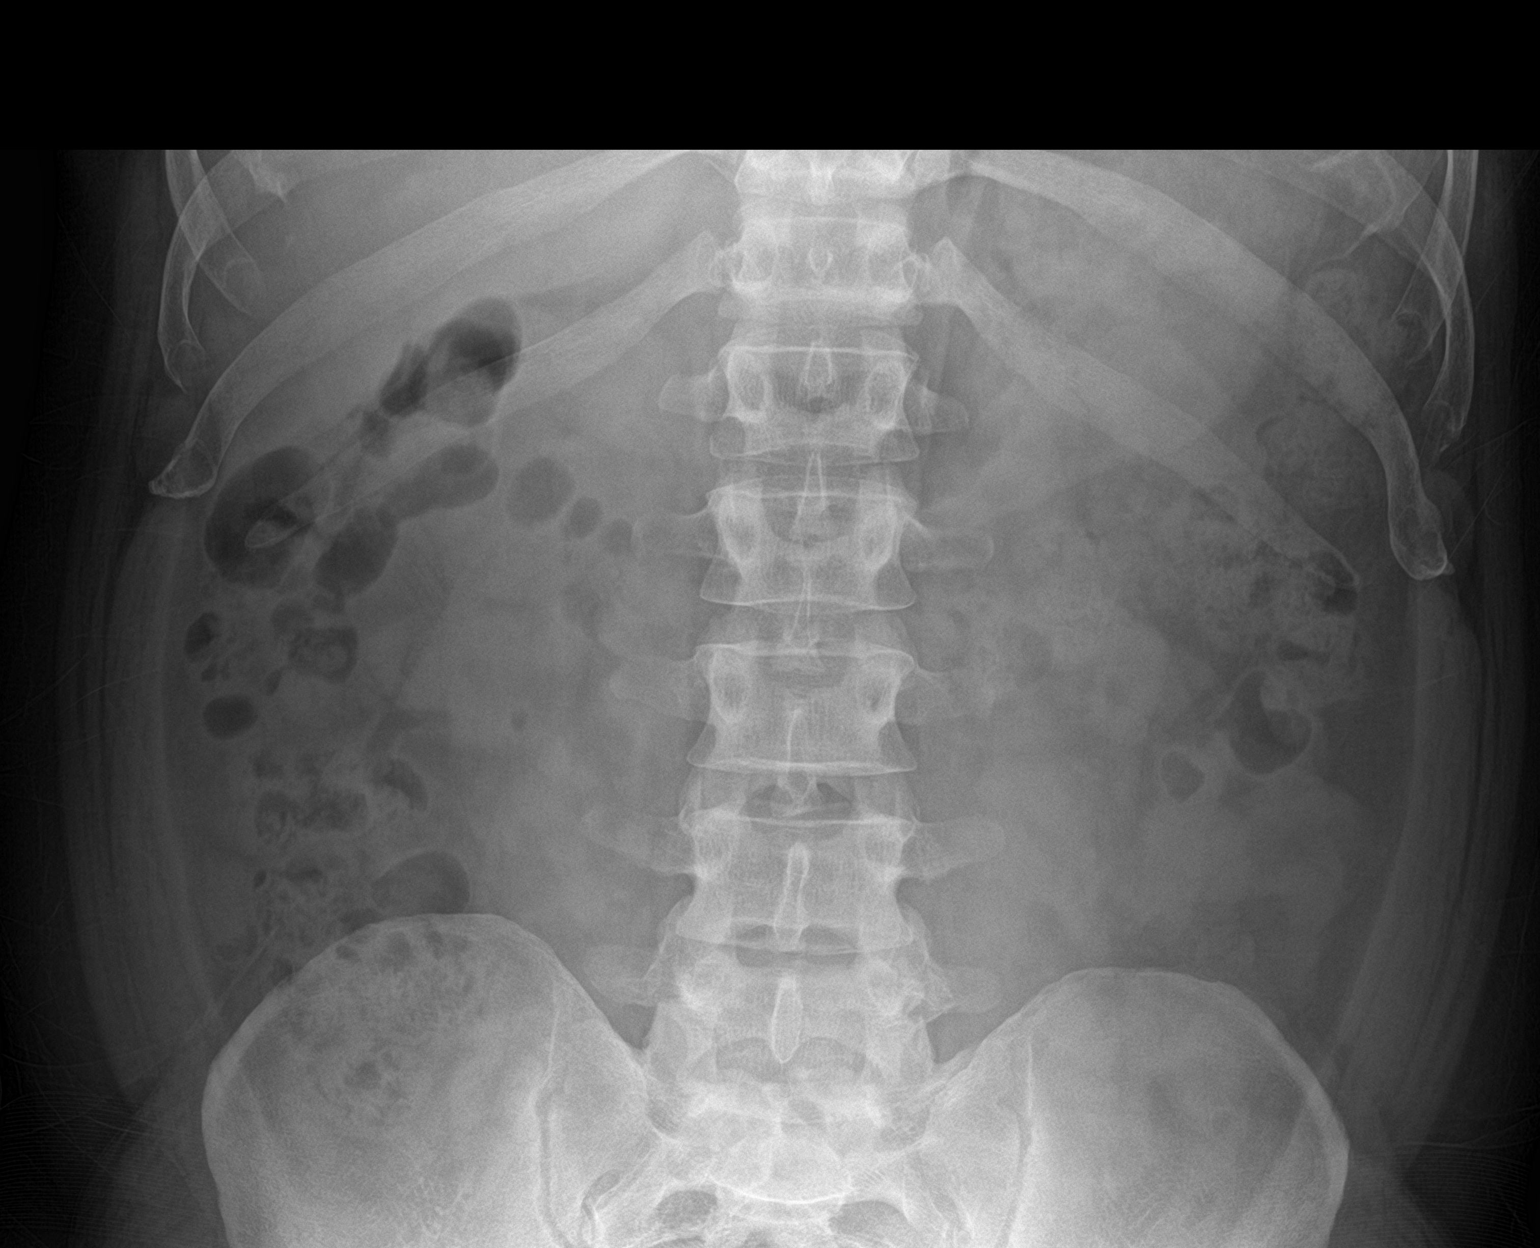

[2 of 2 positions shown; findings below may reference images not displayed]

FINDINGS: No abnormal calcifications are evident. There is moderate stool in
the colon. There is no bowel dilatation or air-fluid levels
suggesting bowel obstruction. No free air.
IMPRESSION: Moderate stool in colon. Bowel gas pattern unremarkable. No abnormal
calcifications evident.

## 2018-05-28 ENCOUNTER — Encounter: Payer: Self-pay | Admitting: Sports Medicine

## 2018-05-28 ENCOUNTER — Ambulatory Visit (INDEPENDENT_AMBULATORY_CARE_PROVIDER_SITE_OTHER): Payer: BLUE CROSS/BLUE SHIELD | Admitting: Sports Medicine

## 2018-05-28 DIAGNOSIS — E291 Testicular hypofunction: Secondary | ICD-10-CM | POA: Diagnosis not present

## 2018-05-28 DIAGNOSIS — I10 Essential (primary) hypertension: Secondary | ICD-10-CM

## 2018-05-28 DIAGNOSIS — Z0000 Encounter for general adult medical examination without abnormal findings: Secondary | ICD-10-CM | POA: Diagnosis not present

## 2018-05-28 DIAGNOSIS — J302 Other seasonal allergic rhinitis: Secondary | ICD-10-CM | POA: Diagnosis not present

## 2018-05-28 MED ORDER — SILDENAFIL CITRATE 20 MG PO TABS: 20.0000 mg | ORAL_TABLET | ORAL | 11 refills | Status: DC | PRN

## 2018-05-28 MED ORDER — ALBUTEROL SULFATE HFA 108 (90 BASE) MCG/ACT IN AERS: 1.0000 | INHALATION_SPRAY | Freq: Four times a day (QID) | RESPIRATORY_TRACT | 11 refills | Status: DC | PRN

## 2018-05-28 MED ORDER — MONTELUKAST SODIUM 10 MG PO TABS: 10.0000 mg | ORAL_TABLET | Freq: Every day | ORAL | 0 refills | Status: DC

## 2018-05-28 NOTE — Progress Notes (Signed)
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  He never got a sleep study done last year.  I reviewed the past medical history, family history, social history, surgical history, and allergies today and no changes were needed.  Please see the problem list section below in epic for further details.  Past Medical History: Past Medical History:  Diagnosis Date  . Allergy   . Asthma   . Calcium nephrolithiasis   . Hypogonadism in male   . Obesity    Past Surgical History: Past Surgical History:  Procedure Laterality Date  . NO PAST SURGERIES     Social History: Social History   Socioeconomic History  . Marital status: Married    Spouse name: Not on file  . Number of children: Not on file  . Years of education: Not on file  . Highest education level: Not on file  Occupational History  . Not on file  Social Needs  . Financial resource strain: Not on file  . Food insecurity:    Worry: Not on file    Inability: Not on file  . Transportation needs:    Medical: Not on file    Non-medical: Not on file  Tobacco Use  . Smoking status: Former Research scientist (life sciences)  . Smokeless tobacco: Never Used  Substance and Sexual Activity  . Alcohol use: No  . Drug use: No  . Sexual activity: Yes  Lifestyle  . Physical activity:    Days per week: Not on file    Minutes per session: Not on file  . Stress: Not on file  Relationships  . Social connections:    Talks on phone: Not on file    Gets together: Not on file    Attends religious service: Not on file    Active member of club or organization: Not on file    Attends meetings of clubs or organizations: Not on file    Relationship status: Not on file  Other Topics Concern  . Not on file  Social History Narrative  . Not on file   Family History: Family History  Problem Relation Age of Onset  .  Hypertension Unknown        parent   Allergies: No Known Allergies Medications: See med rec.  Review of Systems: No headache, visual changes, nausea, vomiting, diarrhea, constipation, dizziness, abdominal pain, skin rash, fevers, chills, night sweats, swollen lymph nodes, weight loss, chest pain, body aches, joint swelling, muscle aches, shortness of breath, mood changes, visual or auditory hallucinations.  Objective:    General: Well Developed, well nourished, and in no acute distress.  Neuro: Alert and oriented x3, extra-ocular muscles intact, sensation grossly intact. Cranial nerves II through XII are intact, motor, sensory, and coordinative functions are all intact. HEENT: Normocephalic, atraumatic, pupils equal round reactive to light, neck supple, no masses, no lymphadenopathy, thyroid nonpalpable. Oropharynx, nasopharynx, external ear canals are unremarkable. Skin: Warm and dry, no rashes noted.  Cardiac: Regular rate and rhythm, no murmurs rubs or gallops.  Respiratory: Clear to auscultation bilaterally. Not using accessory muscles, speaking in full sentences.  Abdominal: Soft, nontender, nondistended, positive bowel sounds, no masses, no organomegaly.  Musculoskeletal: Shoulder, elbow, wrist, hip, knee, ankle stable, and with full range of motion.  Impression and Recommendations:    The patient was counselled, risk factors were discussed, anticipatory guidance given.  Annual physical exam Routine physical as above. He did have his routine labs done with his employer, these look good, LDL was only slightly elevated, liver, kidney function normal, A1c normal. PSA normal.  Benign essential hypertension Persistently elevated blood pressure. Ordering sleep study. ___________________________________________ Gwen Her. Dianah Field, M.D., ABFM., CAQSM. Primary Care and Sports Medicine [facility]  Adjunct Professor of [place] of [facility] of Medicine

## 2018-05-28 NOTE — Addendum Note (Signed)
Addended by: Silverio Decamp on: 05/28/2018 11:43 AM   Modules accepted: Orders

## 2018-05-28 NOTE — Assessment & Plan Note (Addendum)
Routine physical as above. He did have his routine labs done with his employer, these look good, LDL was only slightly elevated, liver, kidney function normal, A1c normal. PSA normal.

## 2018-05-28 NOTE — Assessment & Plan Note (Signed)
Persistently elevated blood pressure. Ordering sleep study.

## 2018-06-10 ENCOUNTER — Encounter: Payer: Self-pay | Admitting: Sports Medicine

## 2018-08-30 ENCOUNTER — Other Ambulatory Visit: Payer: Self-pay | Admitting: Sports Medicine

## 2018-08-30 DIAGNOSIS — J302 Other seasonal allergic rhinitis: Secondary | ICD-10-CM

## 2019-01-08 ENCOUNTER — Emergency Department: Admission: EM | Admit: 2019-01-08 | Discharge: 2019-01-08 | Discharge status: Absent without leave | Payer: Self-pay

## 2019-06-16 ENCOUNTER — Encounter: Payer: Self-pay | Admitting: Sports Medicine

## 2019-06-16 ENCOUNTER — Ambulatory Visit (INDEPENDENT_AMBULATORY_CARE_PROVIDER_SITE_OTHER): Payer: BC Managed Care – PPO | Admitting: Sports Medicine

## 2019-06-16 ENCOUNTER — Other Ambulatory Visit: Payer: Self-pay

## 2019-06-16 VITALS — BP 134/85 | HR 73 | Ht 68.5 in | Wt 241.0 lb

## 2019-06-16 DIAGNOSIS — R1013 Epigastric pain: Secondary | ICD-10-CM

## 2019-06-16 DIAGNOSIS — Z0000 Encounter for general adult medical examination without abnormal findings: Secondary | ICD-10-CM

## 2019-06-16 DIAGNOSIS — I10 Essential (primary) hypertension: Secondary | ICD-10-CM

## 2019-06-16 DIAGNOSIS — Z23 Encounter for immunization: Secondary | ICD-10-CM

## 2019-06-16 DIAGNOSIS — N5201 Erectile dysfunction due to arterial insufficiency: Secondary | ICD-10-CM | POA: Diagnosis not present

## 2019-06-16 DIAGNOSIS — E291 Testicular hypofunction: Secondary | ICD-10-CM

## 2019-06-16 MED ORDER — PANTOPRAZOLE SODIUM 40 MG PO TBEC: 40.0000 mg | DELAYED_RELEASE_TABLET | Freq: Every day | ORAL | 3 refills | Status: DC

## 2019-06-16 MED ORDER — TADALAFIL 5 MG PO TABS: 5.0000 mg | ORAL_TABLET | Freq: Every day | ORAL | 3 refills | Status: DC | PRN

## 2019-06-16 NOTE — Assessment & Plan Note (Addendum)
Did okay initially with sildenafil, he borrowed some of her friend Cialis and it worked much better. Switching from sildenafil to Cialis 5 mg #30. Good Rx coupon given.

## 2019-06-16 NOTE — Progress Notes (Signed)
Subjective:    CC: Annual Physical Exam  HPI:  This patient is here for their annual physical  I reviewed the past medical history, family history, social history, surgical history, and allergies today and no changes were needed.  Please see the problem list section below in epic for further details.  Past Medical History: Past Medical History:  Diagnosis Date  . Allergy   . Asthma   . Calcium nephrolithiasis   . Hypogonadism in male   . Obesity    Past Surgical History: Past Surgical History:  Procedure Laterality Date  . NO PAST SURGERIES     Social History: Social History   Socioeconomic History  . Marital status: Married    Spouse name: Not on file  . Number of children: Not on file  . Years of education: Not on file  . Highest education level: Not on file  Occupational History  . Not on file  Tobacco Use  . Smoking status: Former Research scientist (life sciences)  . Smokeless tobacco: Never Used  Substance and Sexual Activity  . Alcohol use: No  . Drug use: No  . Sexual activity: Yes  Other Topics Concern  . Not on file  Social History Narrative  . Not on file   Social Determinants of Health   Financial Resource Strain:   . Difficulty of Paying Living Expenses: Not on file  Food Insecurity:   . Worried About Charity fundraiser in the Last Year: Not on file  . Ran Out of Food in the Last Year: Not on file  Transportation Needs:   . Lack of Transportation (Medical): Not on file  . Lack of Transportation (Non-Medical): Not on file  Physical Activity:   . Days of Exercise per Week: Not on file  . Minutes of Exercise per Session: Not on file  Stress:   . Feeling of Stress : Not on file  Social Connections:   . Frequency of Communication with Friends and Family: Not on file  . Frequency of Social Gatherings with Friends and Family: Not on file  . Attends Religious Services: Not on file  . Active Member of Clubs or Organizations: Not on file  . Attends Archivist  Meetings: Not on file  . Marital Status: Not on file   Family History: Family History  Problem Relation Age of Onset  . Hypertension Unknown        parent   Allergies: No Known Allergies Medications: See med rec.  Review of Systems: No headache, visual changes, nausea, vomiting, diarrhea, constipation, dizziness, abdominal pain, skin rash, fevers, chills, night sweats, swollen lymph nodes, weight loss, chest pain, body aches, joint swelling, muscle aches, shortness of breath, mood changes, visual or auditory hallucinations.  Objective:    General: Well Developed, well nourished, and in no acute distress.  Neuro: Alert and oriented x3, extra-ocular muscles intact, sensation grossly intact. Cranial nerves II through XII are intact, motor, sensory, and coordinative functions are all intact. HEENT: Normocephalic, atraumatic, pupils equal round reactive to light, neck supple, no masses, no lymphadenopathy, thyroid nonpalpable. Oropharynx, nasopharynx, external ear canals are unremarkable. Skin: Warm and dry, no rashes noted.  Cardiac: Regular rate and rhythm, no murmurs rubs or gallops.  Respiratory: Clear to auscultation bilaterally. Not using accessory muscles, speaking in full sentences.  Abdominal: Soft, nontender, nondistended, positive bowel sounds, no masses, no organomegaly.  Musculoskeletal: Shoulder, elbow, wrist, hip, knee, ankle stable, and with full range of motion.  Impression and Recommendations:  The patient was counselled, risk factors were discussed, anticipatory guidance given.  Annual physical exam Annual physical as above, adding routine labs.   Benign essential hypertension Adequately controlled, no changes.  Erectile dysfunction Did okay initially with sildenafil, he borrowed some of her friend Cialis and it worked much better. Switching from sildenafil to Cialis 5 mg #30. Good Rx coupon given.  Midepigastric pain Georgina Snell has had some midepigastric pain  when eating greasy meals. Sounds like mild dyspepsia/indigestion. No chest pain with exertion, nausea, diaphoresis. No shortness of breath. No melena, hematochezia. We will try Protonix 40 mg daily for 6 weeks, if no resolution we will proceed with imaging of his gallbladder and referral to gastroenterology to discuss upper endoscopy.   ___________________________________________ Gwen Her. Dianah Field, M.D., ABFM., CAQSM. Primary Care and Sports Medicine [facility]  Adjunct Professor of [place] [employer] [facility] [employer]

## 2019-06-16 NOTE — Assessment & Plan Note (Signed)
Annual physical as above, adding routine labs.

## 2019-06-16 NOTE — Assessment & Plan Note (Addendum)
Jose Rojas has had some midepigastric pain when eating greasy meals. Sounds like mild dyspepsia/indigestion. No chest pain with exertion, nausea, diaphoresis. No shortness of breath. No melena, hematochezia. We will try Protonix 40 mg daily for 6 weeks, if no resolution we will proceed with imaging of his gallbladder and referral to gastroenterology to discuss upper endoscopy.

## 2019-06-16 NOTE — Assessment & Plan Note (Signed)
Adequately controlled, no changes 

## 2019-06-17 MED ORDER — VITAMIN D (ERGOCALCIFEROL) 1.25 MG (50000 UNIT) PO CAPS: 50000.0000 [IU] | ORAL_CAPSULE | ORAL | 0 refills | Status: DC

## 2019-06-17 NOTE — Addendum Note (Signed)
Addended by: Silverio Decamp on: 06/17/2019 08:32 AM   Modules accepted: Orders

## 2019-06-20 LAB — LIPID PANEL W/REFLEX DIRECT LDL
Cholesterol: 207 mg/dL — ABNORMAL HIGH (ref <200)
HDL: 43 mg/dL (ref >=40)
LDL Cholesterol (Calc): 150 mg/dL (calc) — ABNORMAL HIGH
Non-HDL Cholesterol (Calc): 164 mg/dL (calc) — ABNORMAL HIGH (ref <130)
Total CHOL/HDL Ratio: 4.8 (calc) (ref <5.0)
Triglycerides: 52 mg/dL (ref <150)

## 2019-06-20 LAB — CBC
HCT: 43.1 % (ref 38.5–50.0)
Hemoglobin: 14.5 g/dL (ref 13.2–17.1)
MCH: 28.3 pg (ref 27.0–33.0)
MCHC: 33.6 g/dL (ref 32.0–36.0)
MCV: 84.2 fL (ref 80.0–100.0)
MPV: 10.9 fL (ref 7.5–12.5)
Platelets: 171 10*3/uL (ref 140–400)
RBC: 5.12 10*6/uL (ref 4.20–5.80)
RDW: 15 % (ref 11.0–15.0)
WBC: 5.2 10*3/uL (ref 3.8–10.8)

## 2019-06-20 LAB — TESTOSTERONE, FREE & TOTAL
Free Testosterone: 80.8 pg/mL (ref 35.0–155.0)
Testosterone, Total, LC-MS-MS: 442 ng/dL (ref 250–1100)

## 2019-06-20 LAB — COMPLETE METABOLIC PANEL WITH GFR
AG Ratio: 1.8 (calc) (ref 1.0–2.5)
ALT: 16 U/L (ref 9–46)
AST: 15 U/L (ref 10–40)
Albumin: 4.6 g/dL (ref 3.6–5.1)
Alkaline phosphatase (APISO): 64 U/L (ref 36–130)
BUN: 9 mg/dL (ref 7–25)
CO2: 29 mmol/L (ref 20–32)
Calcium: 9.6 mg/dL (ref 8.6–10.3)
Chloride: 107 mmol/L (ref 98–110)
Creat: 1.08 mg/dL (ref 0.60–1.35)
GFR, Est African American: 95 mL/min/{1.73_m2} (ref >=60)
GFR, Est Non African American: 82 mL/min/{1.73_m2} (ref >=60)
Globulin: 2.5 g/dL (calc) (ref 1.9–3.7)
Glucose, Bld: 106 mg/dL — ABNORMAL HIGH (ref 65–99)
Potassium: 4.2 mmol/L (ref 3.5–5.3)
Sodium: 141 mmol/L (ref 135–146)
Total Bilirubin: 0.8 mg/dL (ref 0.2–1.2)
Total Protein: 7.1 g/dL (ref 6.1–8.1)

## 2019-06-20 LAB — PSA, TOTAL AND FREE
PSA, % Free: 38 % (calc) (ref >25)
PSA, Free: 0.5 ng/mL
PSA, Total: 1.3 ng/mL (ref <=4.0)

## 2019-06-20 LAB — TSH: TSH: 1.64 mIU/L (ref 0.40–4.50)

## 2019-06-20 LAB — VITAMIN D 25 HYDROXY (VIT D DEFICIENCY, FRACTURES): Vit D, 25-Hydroxy: 16 ng/mL — ABNORMAL LOW (ref 30–100)

## 2019-07-08 ENCOUNTER — Ambulatory Visit: Payer: BC Managed Care – PPO | Admitting: Medical-Surgical

## 2019-07-09 ENCOUNTER — Encounter: Payer: Self-pay | Admitting: Medical-Surgical

## 2019-07-09 ENCOUNTER — Ambulatory Visit (INDEPENDENT_AMBULATORY_CARE_PROVIDER_SITE_OTHER): Payer: BC Managed Care – PPO | Admitting: Medical-Surgical

## 2019-07-09 VITALS — BP 139/93 | HR 76 | Temp 97.8°F | Ht 68.5 in | Wt 237.1 lb

## 2019-07-09 DIAGNOSIS — E785 Hyperlipidemia, unspecified: Secondary | ICD-10-CM

## 2019-07-09 DIAGNOSIS — L309 Dermatitis, unspecified: Secondary | ICD-10-CM | POA: Diagnosis not present

## 2019-07-09 MED ORDER — ATORVASTATIN CALCIUM 20 MG PO TABS: 20.0000 mg | ORAL_TABLET | Freq: Every day | ORAL | 3 refills | Status: DC

## 2019-07-09 MED ORDER — PREDNISONE 10 MG (48) PO TBPK: ORAL_TABLET | Freq: Every day | ORAL | 0 refills | Status: DC

## 2019-07-09 NOTE — Assessment & Plan Note (Signed)
Starting atorvastatin 71m daily. Plan to recheck liver function in 6 weeks. Reviewed warning signs/side effects to monitor for and report.

## 2019-07-09 NOTE — Assessment & Plan Note (Signed)
Questionable etiology, suspicious for contact dermatitis. Low suspicion for drug reaction. Will trial Prednisone 50m taper. If no improvement in 3-4 days or for worsening symptoms, will need to reevaluate. May continue to use Benadryl for itching as needed.

## 2019-07-09 NOTE — Progress Notes (Signed)
Subjective:    CC: Rash on arms and feet  HPI: Pleasant 49 year old male presenting today with complaint of rash that developed 2 weeks ago on bilateral forearms and tops of both feet.  Bumps are raised, itchy only when in shower.  Bumps on feet are somewhat red.  Has been using over-the-counter cream (unsure of name) and p.o. Benadryl with no relief.  Stopped taking fish oil and garlic, no relief.  No new soaps, detergents, shampoos, colognes.  No waxing or waning of the rash, no drainage from bumps.  Has not been scratching.  Started Cialis approximately 1 week prior to rash development but is taking only as needed with no change in rash with doses.  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  Labs checked in January showed elevated cholesterol.  Several attempts were made to contact patient to determine if he is willing to start cholesterol medication but no response.  Expressing willingness to start cholesterol medication today.  I reviewed the past medical history, family history, social history, surgical history, and allergies today and no changes were needed.  Please see the problem list section below in epic for further details.  Past Medical History: Past Medical History:  Diagnosis Date  . Allergy   . Asthma   . Calcium nephrolithiasis   . Hypogonadism in male   . Obesity    Past Surgical History: Past Surgical History:  Procedure Laterality Date  . NO PAST SURGERIES     Social History: Social History   Socioeconomic History  . Marital status: Married    Spouse name: Not on file  . Number of children: Not on file  . Years of education: Not on file  . Highest education level: Not on file  Occupational History  . Not on file  Tobacco Use  . Smoking status: Former Research scientist (life sciences)  . Smokeless tobacco: Never Used  Substance and Sexual Activity  . Alcohol use: No  . Drug use: No  . Sexual activity: Yes  Other Topics Concern  . Not on file   Social History Narrative  . Not on file   Social Determinants of Health   Financial Resource Strain:   . Difficulty of Paying Living Expenses: Not on file  Food Insecurity:   . Worried About Charity fundraiser in the Last Year: Not on file  . Ran Out of Food in the Last Year: Not on file  Transportation Needs:   . Lack of Transportation (Medical): Not on file  . Lack of Transportation (Non-Medical): Not on file  Physical Activity:   . Days of Exercise per Week: Not on file  . Minutes of Exercise per Session: Not on file  Stress:   . Feeling of Stress : Not on file  Social Connections:   . Frequency of Communication with Friends and Family: Not on file  . Frequency of Social Gatherings with Friends and Family: Not on file  . Attends Religious Services: Not on file  . Active Member of Clubs or Organizations: Not on file  . Attends Archivist Meetings: Not on file  . Marital Status: Not on file   Family History: Family History  Problem Relation Age of Onset  . Hypertension Unknown        parent   Allergies: No Known Allergies Medications: See med rec.  Review of Systems: No fevers, chills, night sweats, weight loss, chest pain, or shortness of breath.   Objective:  General: Well Developed, well nourished, and in no acute distress.  Neuro: Alert and oriented x3.  HEENT: Normocephalic, atraumatic.  Skin: Warm and dry.  Papular rash to dorsal aspect of bilateral forearms without erythema, no pustules or vesicles noted, left worse than right.  Maculopapular rash to bilateral tops of feet with mild erythema, no pustules or vesicles. Cardiac: Regular rate and rhythm, no murmurs rubs or gallops, no lower extremity edema.  Respiratory: Clear to auscultation bilaterally. Not using accessory muscles, speaking in full sentences.   Impression and Recommendations:    Dermatitis Questionable etiology, suspicious for contact dermatitis. Low suspicion for drug reaction.  Will trial Prednisone 9m taper. If no improvement in 3-4 days or for worsening symptoms, will need to reevaluate. May continue to use Benadryl for itching as needed.   Hyperlipidemia LDL goal <100 Starting atorvastatin 261mdaily. Plan to recheck liver function in 6 weeks. Reviewed warning signs/side effects to monitor for and report.   Return for follow up with Dr. T Darene Lamern 08/13/2019 as scheduled.  ___________________________________________ JoClearnce SorrelDNP, APRN, FNP-BC Primary Care and Sports Medicine [place]

## 2019-08-02 ENCOUNTER — Ambulatory Visit: Payer: BC Managed Care – PPO | Admitting: Sports Medicine

## 2019-08-13 ENCOUNTER — Ambulatory Visit (INDEPENDENT_AMBULATORY_CARE_PROVIDER_SITE_OTHER): Payer: BC Managed Care – PPO | Admitting: Sports Medicine

## 2019-08-13 ENCOUNTER — Other Ambulatory Visit: Payer: Self-pay

## 2019-08-13 ENCOUNTER — Encounter: Payer: Self-pay | Admitting: Sports Medicine

## 2019-08-13 VITALS — BP 142/83 | HR 65 | Temp 98.1°F | Wt 235.1 lb

## 2019-08-13 DIAGNOSIS — E785 Hyperlipidemia, unspecified: Secondary | ICD-10-CM

## 2019-08-13 DIAGNOSIS — R7303 Prediabetes: Secondary | ICD-10-CM | POA: Diagnosis not present

## 2019-08-13 DIAGNOSIS — R7301 Impaired fasting glucose: Secondary | ICD-10-CM | POA: Diagnosis not present

## 2019-08-13 DIAGNOSIS — N5201 Erectile dysfunction due to arterial insufficiency: Secondary | ICD-10-CM

## 2019-08-13 LAB — POCT GLYCOSYLATED HEMOGLOBIN (HGB A1C): Hemoglobin A1C: 5.7 % — AB (ref 4.0–5.6)

## 2019-08-13 MED ORDER — TADALAFIL 20 MG PO TABS: 20.0000 mg | ORAL_TABLET | Freq: Every day | ORAL | 11 refills | Status: DC | PRN

## 2019-08-13 NOTE — Assessment & Plan Note (Signed)
Core returns, he has been taking atorvastatin for about a month now. He will go to the lab fasting in another month to check a CMP and lipid panel again.

## 2019-08-13 NOTE — Progress Notes (Signed)
    Procedures performed today:    None.  Independent interpretation of notes and tests performed by another provider:   None.  Impression and Recommendations:    Hyperlipidemia LDL goal <100 Core returns, he has been taking atorvastatin for about a month now. He will go to the lab fasting in another month to check a CMP and lipid panel again.  Prediabetes Noted on CMP. Checking A1c today. Hemoglobin A1c is 5.7%, he will do a low-dose carbohydrate diet, and plan to lose about 20 pounds. We can recheck this in 3 months.  Erectile dysfunction Insufficient response to Cialis 5, he has done 20 mg in the past, switching to this. He did endorse a rash on his arms that he thought may have been related to the Cialis but he recalls taking it several times in the past without a similar reaction. I do not think it is related.    ___________________________________________ Gwen Her. Dianah Field, M.D., ABFM., CAQSM. Primary Care and [place] Instructor of [place] of [facility] of Medicine

## 2019-08-13 NOTE — Assessment & Plan Note (Signed)
Insufficient response to Cialis 5, he has done 20 mg in the past, switching to this. He did endorse a rash on his arms that he thought may have been related to the Cialis but he recalls taking it several times in the past without a similar reaction. I do not think it is related.

## 2019-08-13 NOTE — Assessment & Plan Note (Addendum)
Noted on CMP. Checking A1c today. Hemoglobin A1c is 5.7%, he will do a low-dose carbohydrate diet, and plan to lose about 20 pounds. We can recheck this in 3 months.

## 2019-08-13 NOTE — Patient Instructions (Signed)
Preventing High Cholesterol Cholesterol is a white, waxy substance similar to fat that the human body needs to help build cells. The liver makes all the cholesterol that a person's body needs. Having high cholesterol (hypercholesterolemia) increases a person's risk for heart disease and stroke. Extra (excess) cholesterol comes from the food the person eats. High cholesterol can often be prevented with diet and lifestyle changes. If you already have high cholesterol, you can control it with diet and lifestyle changes and with medicine. How can high cholesterol affect me? If you have high cholesterol, deposits (plaques) may build up on the walls of your arteries. The arteries are the blood vessels that carry blood away from your heart. Plaques make the arteries narrower and stiffer. This can limit or block blood flow and cause blood clots to form. Blood clots:  Are tiny balls of cells that form in your blood.  Can move to the heart or brain, causing a heart attack or stroke. Plaques in arteries greatly increase your risk for heart attack and stroke.Making diet and lifestyle changes can reduce your risk for these conditions that may threaten your life. What can increase my risk? This condition is more likely to develop in people who:  Eat foods that are high in saturated fat or cholesterol. Saturated fat is mostly found in: ? Foods that contain animal fat, such as red meat and some dairy products. ? Certain fatty foods made from plants, such as tropical oils.  Are overweight.  Are not getting enough exercise.  Have a family history of high cholesterol. What actions can I take to prevent this? Nutrition   Eat less saturated fat.  Avoid trans fats (partially hydrogenated oils). These are often found in margarine and in some baked goods, fried foods, and snacks bought in packages.  Avoid precooked or cured meat, such as sausages or meat loaves.  Avoid foods and drinks that have added  sugars.  Eat more fruits, vegetables, and whole grains.  Choose healthy sources of protein, such as fish, poultry, lean cuts of red meat, beans, peas, lentils, and nuts.  Choose healthy sources of fat, such as: ? Nuts. ? Vegetable oils, especially olive oil. ? Fish that have healthy fats (omega-3 fatty acids), such as mackerel or salmon. The items listed above may not be a complete list of recommended foods and beverages. Contact a dietitian for more information. Lifestyle  Lose weight if you are overweight. Losing 5-10 lb (2.3-4.5 kg) can help prevent or control high cholesterol. It can also lower your risk for diabetes and high blood pressure. Ask your health care provider to help you with a diet and exercise plan to lose weight safely.  Do not use any products that contain nicotine or tobacco, such as cigarettes, e-cigarettes, and chewing tobacco. If you need help quitting, ask your health care provider.  Limit your alcohol intake. ? Do not drink alcohol if:  Your health care provider tells you not to drink.  You are pregnant, may be pregnant, or are planning to become pregnant. ? If you drink alcohol:  Limit how much you use to:  0-1 drink a day for women.  0-2 drinks a day for men.  Be aware of how much alcohol is in your drink. In the [place] one drink equals one 12 oz bottle of beer (355 mL), one 5 oz glass of wine (148 mL), or one 1 oz glass of hard liquor (44 mL). Activity   Get enough exercise. Each week, do at   least 150 minutes of exercise that takes a medium level of effort (moderate-intensity exercise). ? This is exercise that:  Makes your heart beat faster and makes you breathe harder than usual.  Allows you to still be able to talk. ? You could exercise in short sessions several times a day or longer sessions a few times a week. For example, on 5 days each week, you could walk fast or ride your bike 3 times a day for 10 minutes each time.  Do exercises as told  by your health care provider. Medicines  In addition to diet and lifestyle changes, your health care provider may recommend medicines to help lower cholesterol. This may be a medicine to lower the amount of cholesterol your liver makes. You may need medicine if: ? Diet and lifestyle changes do not lower your cholesterol enough. ? You have high cholesterol and other risk factors for heart disease or stroke.  Take over-the-counter and prescription medicines only as told by your health care provider. General information  Manage your risk factors for high cholesterol. Talk with your health care provider about all your risk factors and how to lower your risk.  Manage other conditions that you have, such as diabetes or high blood pressure (hypertension).  Have blood tests to check your cholesterol levels at regular points in time as told by your health care provider.  Keep all follow-up visits as told by your health care provider. This is important. Where to find more information  American Heart Association: www.heart.org  National Heart, Lung, and Blood Institute: www.nhlbi.nih.gov Summary  High cholesterol increases your risk for heart disease and stroke. By keeping your cholesterol level low, you can reduce your risk for these conditions.  High cholesterol can often be prevented with diet and lifestyle changes.  Work with your health care provider to manage your risk factors, and have your blood tested regularly. This information is not intended to replace advice given to you by your health care provider. Make sure you discuss any questions you have with your health care provider. Document Revised: 09/11/2018 Document Reviewed: 01/27/2016 Elsevier Patient Education  2020 Elsevier Inc.  

## 2019-10-18 ENCOUNTER — Other Ambulatory Visit: Payer: Self-pay | Admitting: Sports Medicine

## 2019-10-22 ENCOUNTER — Other Ambulatory Visit: Payer: Self-pay | Admitting: Sports Medicine

## 2019-10-22 DIAGNOSIS — J302 Other seasonal allergic rhinitis: Secondary | ICD-10-CM

## 2019-10-22 MED ORDER — MONTELUKAST SODIUM 10 MG PO TABS: 10.0000 mg | ORAL_TABLET | Freq: Every day | ORAL | 3 refills | Status: DC

## 2019-10-25 LAB — COMPLETE METABOLIC PANEL WITH GFR
AG Ratio: 2 (calc) (ref 1.0–2.5)
ALT: 22 U/L (ref 9–46)
AST: 16 U/L (ref 10–40)
Albumin: 4.5 g/dL (ref 3.6–5.1)
Alkaline phosphatase (APISO): 70 U/L (ref 36–130)
BUN: 9 mg/dL (ref 7–25)
CO2: 28 mmol/L (ref 20–32)
Calcium: 9.5 mg/dL (ref 8.6–10.3)
Chloride: 106 mmol/L (ref 98–110)
Creat: 0.98 mg/dL (ref 0.60–1.35)
GFR, Est African American: 106 mL/min/{1.73_m2} (ref >=60)
GFR, Est Non African American: 91 mL/min/{1.73_m2} (ref >=60)
Globulin: 2.3 g/dL (calc) (ref 1.9–3.7)
Glucose, Bld: 106 mg/dL — ABNORMAL HIGH (ref 65–99)
Potassium: 4 mmol/L (ref 3.5–5.3)
Sodium: 140 mmol/L (ref 135–146)
Total Bilirubin: 0.7 mg/dL (ref 0.2–1.2)
Total Protein: 6.8 g/dL (ref 6.1–8.1)

## 2019-10-25 LAB — LIPID PANEL W/REFLEX DIRECT LDL
Cholesterol: 125 mg/dL (ref <200)
HDL: 41 mg/dL (ref >=40)
LDL Cholesterol (Calc): 70 mg/dL (calc)
Non-HDL Cholesterol (Calc): 84 mg/dL (calc) (ref <130)
Total CHOL/HDL Ratio: 3 (calc) (ref <5.0)
Triglycerides: 55 mg/dL (ref <150)

## 2019-11-15 ENCOUNTER — Ambulatory Visit: Payer: BC Managed Care – PPO | Admitting: Sports Medicine

## 2020-07-17 ENCOUNTER — Other Ambulatory Visit: Payer: Self-pay

## 2020-07-17 DIAGNOSIS — E785 Hyperlipidemia, unspecified: Secondary | ICD-10-CM

## 2020-07-17 MED ORDER — ATORVASTATIN CALCIUM 20 MG PO TABS: 20.0000 mg | ORAL_TABLET | Freq: Every day | ORAL | 1 refills | Status: DC

## 2020-08-25 ENCOUNTER — Encounter: Payer: Self-pay | Admitting: Sports Medicine

## 2020-08-25 ENCOUNTER — Other Ambulatory Visit: Payer: Self-pay

## 2020-08-25 ENCOUNTER — Ambulatory Visit (INDEPENDENT_AMBULATORY_CARE_PROVIDER_SITE_OTHER): Payer: BC Managed Care – PPO | Admitting: Sports Medicine

## 2020-08-25 VITALS — BP 145/85 | HR 73 | Ht 68.5 in | Wt 251.0 lb

## 2020-08-25 DIAGNOSIS — I10 Essential (primary) hypertension: Secondary | ICD-10-CM

## 2020-08-25 DIAGNOSIS — R7303 Prediabetes: Secondary | ICD-10-CM

## 2020-08-25 DIAGNOSIS — Z0000 Encounter for general adult medical examination without abnormal findings: Secondary | ICD-10-CM | POA: Diagnosis not present

## 2020-08-25 DIAGNOSIS — Z1211 Encounter for screening for malignant neoplasm of colon: Secondary | ICD-10-CM | POA: Diagnosis not present

## 2020-08-25 DIAGNOSIS — E291 Testicular hypofunction: Secondary | ICD-10-CM | POA: Diagnosis not present

## 2020-08-25 DIAGNOSIS — E6609 Other obesity due to excess calories: Secondary | ICD-10-CM

## 2020-08-25 MED ORDER — PHENTERMINE HCL 37.5 MG PO TABS: ORAL_TABLET | ORAL | 0 refills | Status: DC

## 2020-08-25 NOTE — Assessment & Plan Note (Signed)
Starting phentermine, return monthly for weight checks and refills.

## 2020-08-25 NOTE — Assessment & Plan Note (Signed)
Rechecking testosterone again, he does have significant fatigue at this point, previously he had appropriate sex drive without fatigue. He continues Cialis though. 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

## 2020-08-25 NOTE — Progress Notes (Signed)
  Subjective:    CC: Annual Physical Exam  HPI:  This patient is here for their annual physical  I reviewed the past medical history, family history, social history, surgical history, and allergies today and no changes were needed.  Please see the problem list section below in epic for further details.  Past Medical History: Past Medical History:  Diagnosis Date  . Allergy   . Asthma   . Calcium nephrolithiasis   . Hypogonadism in male   . Obesity    Past Surgical History: Past Surgical History:  Procedure Laterality Date  . NO PAST SURGERIES     Social History: Social History   Socioeconomic History  . Marital status: Married    Spouse name: Not on file  . Number of children: Not on file  . Years of education: Not on file  . Highest education level: Not on file  Occupational History  . Not on file  Tobacco Use  . Smoking status: Former Research scientist (life sciences)  . Smokeless tobacco: Never Used  Substance and Sexual Activity  . Alcohol use: No  . Drug use: No  . Sexual activity: Yes  Other Topics Concern  . Not on file  Social History Narrative  . Not on file   Social Determinants of Health   Financial Resource Strain: Not on file  Food Insecurity: Not on file  Transportation Needs: Not on file  Physical Activity: Not on file  Stress: Not on file  Social Connections: Not on file   Family History: Family History  Problem Relation Age of Onset  . Hypertension Unknown        parent   Allergies: No Known Allergies Medications: See med rec.  Review of Systems: No headache, visual changes, nausea, vomiting, diarrhea, constipation, dizziness, abdominal pain, skin rash, fevers, chills, night sweats, swollen lymph nodes, weight loss, chest pain, body aches, joint swelling, muscle aches, shortness of breath, mood changes, visual or auditory hallucinations.  Objective:    General: Well Developed, well nourished, and in no acute distress.  Neuro: Alert and oriented x3,  extra-ocular muscles intact, sensation grossly intact. Cranial nerves II through XII are intact, motor, sensory, and coordinative functions are all intact. HEENT: Normocephalic, atraumatic, pupils equal round reactive to light, neck supple, no masses, no lymphadenopathy, thyroid nonpalpable. Oropharynx, nasopharynx, external ear canals are unremarkable. Skin: Warm and dry, no rashes noted.  Cardiac: Regular rate and rhythm, no murmurs rubs or gallops.  Respiratory: Clear to auscultation bilaterally. Not using accessory muscles, speaking in full sentences.  Abdominal: Soft, nontender, nondistended, positive bowel sounds, no masses, no organomegaly.  Musculoskeletal: Shoulder, elbow, wrist, hip, knee, ankle stable, and with full range of motion.  Impression and Recommendations:    The patient was counselled, risk factors were discussed, anticipatory guidance given.  Annual physical exam Physical as above. Checking routine labs. Ordering Cologuard.  Male hypogonadism Rechecking testosterone again, he does have significant fatigue at this point, previously he had appropriate sex drive without fatigue. He continues Cialis though. 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  Obesity Starting phentermine, return monthly for weight checks and refills.   ___________________________________________ Gwen Her. Dianah Field, M.D., ABFM., CAQSM. Primary Care and Sports Medicine [facility] [facility]  Adjunct Professor of [place] [employer] [facility] of Medicine

## 2020-08-25 NOTE — Assessment & Plan Note (Signed)
Physical as above. Checking routine labs. Ordering Cologuard.

## 2020-08-28 LAB — HEMOGLOBIN A1C
Hgb A1c MFr Bld: 5.5 % of total Hgb (ref <5.7)
Mean Plasma Glucose: 111 mg/dL
eAG (mmol/L): 6.2 mmol/L

## 2020-08-28 LAB — COMPREHENSIVE METABOLIC PANEL
AG Ratio: 1.6 (calc) (ref 1.0–2.5)
ALT: 19 U/L (ref 9–46)
AST: 16 U/L (ref 10–40)
Albumin: 4.4 g/dL (ref 3.6–5.1)
Alkaline phosphatase (APISO): 73 U/L (ref 36–130)
BUN: 8 mg/dL (ref 7–25)
CO2: 26 mmol/L (ref 20–32)
Calcium: 9.4 mg/dL (ref 8.6–10.3)
Chloride: 106 mmol/L (ref 98–110)
Creat: 0.9 mg/dL (ref 0.60–1.35)
Globulin: 2.7 g/dL (calc) (ref 1.9–3.7)
Glucose, Bld: 91 mg/dL (ref 65–99)
Potassium: 3.7 mmol/L (ref 3.5–5.3)
Sodium: 141 mmol/L (ref 135–146)
Total Bilirubin: 1.1 mg/dL (ref 0.2–1.2)
Total Protein: 7.1 g/dL (ref 6.1–8.1)

## 2020-08-28 LAB — CBC
HCT: 46.7 % (ref 38.5–50.0)
Hemoglobin: 15.6 g/dL (ref 13.2–17.1)
MCH: 29.1 pg (ref 27.0–33.0)
MCHC: 33.4 g/dL (ref 32.0–36.0)
MCV: 87 fL (ref 80.0–100.0)
MPV: 11.2 fL (ref 7.5–12.5)
Platelets: 172 10*3/uL (ref 140–400)
RBC: 5.37 10*6/uL (ref 4.20–5.80)
RDW: 15.1 % — ABNORMAL HIGH (ref 11.0–15.0)
WBC: 6.7 10*3/uL (ref 3.8–10.8)

## 2020-08-28 LAB — TSH: TSH: 1.43 mIU/L (ref 0.40–4.50)

## 2020-08-28 LAB — LIPID PANEL
Cholesterol: 146 mg/dL (ref <200)
HDL: 40 mg/dL (ref >=40)
LDL Cholesterol (Calc): 90 mg/dL (calc)
Non-HDL Cholesterol (Calc): 106 mg/dL (calc) (ref <130)
Total CHOL/HDL Ratio: 3.7 (calc) (ref <5.0)
Triglycerides: 72 mg/dL (ref <150)

## 2020-08-28 LAB — TESTOSTERONE, FREE & TOTAL
Free Testosterone: 45.9 pg/mL (ref 35.0–155.0)
Testosterone, Total, LC-MS-MS: 358 ng/dL (ref 250–1100)

## 2020-09-01 ENCOUNTER — Other Ambulatory Visit: Payer: Self-pay | Admitting: Sports Medicine

## 2020-09-01 DIAGNOSIS — N5201 Erectile dysfunction due to arterial insufficiency: Secondary | ICD-10-CM

## 2020-09-25 ENCOUNTER — Other Ambulatory Visit: Payer: Self-pay | Admitting: Sports Medicine

## 2020-09-25 ENCOUNTER — Other Ambulatory Visit: Payer: Self-pay

## 2020-09-25 ENCOUNTER — Ambulatory Visit: Payer: BC Managed Care – PPO | Admitting: Sports Medicine

## 2020-09-25 ENCOUNTER — Encounter: Payer: Self-pay | Admitting: Sports Medicine

## 2020-09-25 DIAGNOSIS — E785 Hyperlipidemia, unspecified: Secondary | ICD-10-CM

## 2020-09-25 DIAGNOSIS — E6609 Other obesity due to excess calories: Secondary | ICD-10-CM

## 2020-09-25 MED ORDER — ATORVASTATIN CALCIUM 20 MG PO TABS: 20.0000 mg | ORAL_TABLET | Freq: Every day | ORAL | 3 refills | Status: DC

## 2020-09-25 NOTE — Assessment & Plan Note (Signed)
This is a very pleasant 50 year old male, he lost 17 pounds over the last month, we had added phentermine but only took it a few days, there was some excessive erectile dysfunction and urinary hesitancy so he dropped to a half tab which seemed to work better, at that point he decided to get more serious with changing around his diet, making sure each meal required a fork and eating lots of salads, avoiding late night eating, and drinking more water. The majority of his weight loss was from his dietary changes. Goal weight is 205 pounds, I would like to see him back in 3 months to make sure that he is still on track and if not we will restart half dose phentermine.

## 2020-09-25 NOTE — Progress Notes (Signed)
    Procedures performed today:    None.  Independent interpretation of notes and tests performed by another provider:   None.  Brief History, Exam, Impression, and Recommendations:    Obesity This is a very pleasant 50 year old male, he lost 17 pounds over the last month, we had added phentermine but only took it a few days, there was some excessive erectile dysfunction and urinary hesitancy so he dropped to a half tab which seemed to work better, at that point he decided to get more serious with changing around his diet, making sure each meal required a fork and eating lots of salads, avoiding late night eating, and drinking more water. The majority of his weight loss was from his dietary changes. Goal weight is 205 pounds, I would like to see him back in 3 months to make sure that he is still on track and if not we will restart half dose phentermine.    ___________________________________________ Gwen Her. Dianah Field, M.D., ABFM., CAQSM. Primary Care and [place] Instructor of [place] of [facility] of Medicine

## 2020-09-27 LAB — COLOGUARD
COLOGUARD: NEGATIVE
Cologuard: NEGATIVE

## 2020-09-28 ENCOUNTER — Encounter: Payer: Self-pay | Admitting: Sports Medicine

## 2020-09-28 NOTE — Progress Notes (Signed)
Results abtracted into chart, patient aware of results, results sent for scanning.

## 2020-12-25 ENCOUNTER — Ambulatory Visit: Payer: BC Managed Care – PPO | Admitting: Sports Medicine

## 2020-12-25 ENCOUNTER — Encounter: Payer: Self-pay | Admitting: Sports Medicine

## 2020-12-25 ENCOUNTER — Other Ambulatory Visit: Payer: Self-pay

## 2020-12-25 DIAGNOSIS — Z87442 Personal history of urinary calculi: Secondary | ICD-10-CM | POA: Diagnosis not present

## 2020-12-25 DIAGNOSIS — E6609 Other obesity due to excess calories: Secondary | ICD-10-CM

## 2020-12-25 NOTE — Assessment & Plan Note (Signed)
Did have an episode of nephrolithiasis, needs to cut out the sodas and increase hydration.

## 2020-12-25 NOTE — Progress Notes (Signed)
    Procedures performed today:    None.  Independent interpretation of notes and tests performed by another provider:   None.  Brief History, Exam, Impression, and Recommendations:    Obesity Jose Rojas returns, he is a very pleasant 50 year old male, 3 months ago he had lost 17 pounds mostly by himself. He has gained 3 pounds over the last 3 months, not too bad. He still feels as though sodas are his advice, he will work hard over the next month to avoid sodas. If he has difficulty we can try Topamax which should curb his desire for carbonated beverage. Return in a month.  History of nephrolithiasis, renal cyst Did have an episode of nephrolithiasis, needs to cut out the sodas and increase hydration.    ___________________________________________ Gwen Her. Dianah Field, M.D., ABFM., CAQSM. Primary Care and [place] Instructor of [place] of [facility] of Medicine

## 2020-12-25 NOTE — Assessment & Plan Note (Signed)
Jose Rojas returns, he is a very pleasant 50 year old male, 3 months ago he had lost 17 pounds mostly by himself. He has gained 3 pounds over the last 3 months, not too bad. He still feels as though sodas are his advice, he will work hard over the next month to avoid sodas. If he has difficulty we can try Topamax which should curb his desire for carbonated beverage. Return in a month.

## 2021-01-09 ENCOUNTER — Other Ambulatory Visit: Payer: Self-pay | Admitting: Sports Medicine

## 2021-01-09 DIAGNOSIS — J302 Other seasonal allergic rhinitis: Secondary | ICD-10-CM

## 2021-01-09 MED ORDER — MONTELUKAST SODIUM 10 MG PO TABS: 10.0000 mg | ORAL_TABLET | Freq: Every day | ORAL | 3 refills | Status: AC

## 2021-01-22 ENCOUNTER — Ambulatory Visit: Payer: BC Managed Care – PPO | Admitting: Sports Medicine

## 2021-09-11 ENCOUNTER — Other Ambulatory Visit: Payer: Self-pay | Admitting: Sports Medicine

## 2021-09-11 DIAGNOSIS — N5201 Erectile dysfunction due to arterial insufficiency: Secondary | ICD-10-CM

## 2021-10-08 ENCOUNTER — Other Ambulatory Visit: Payer: Self-pay

## 2021-10-08 DIAGNOSIS — E785 Hyperlipidemia, unspecified: Secondary | ICD-10-CM

## 2021-10-08 MED ORDER — ATORVASTATIN CALCIUM 20 MG PO TABS: 20.0000 mg | ORAL_TABLET | Freq: Every day | ORAL | 0 refills | Status: DC

## 2021-10-19 ENCOUNTER — Ambulatory Visit (INDEPENDENT_AMBULATORY_CARE_PROVIDER_SITE_OTHER): Payer: BC Managed Care – PPO | Admitting: Sports Medicine

## 2021-10-19 VITALS — BP 144/92 | HR 67 | Ht 68.5 in | Wt 243.0 lb

## 2021-10-19 DIAGNOSIS — N5201 Erectile dysfunction due to arterial insufficiency: Secondary | ICD-10-CM | POA: Diagnosis not present

## 2021-10-19 DIAGNOSIS — Z0000 Encounter for general adult medical examination without abnormal findings: Secondary | ICD-10-CM

## 2021-10-19 DIAGNOSIS — I10 Essential (primary) hypertension: Secondary | ICD-10-CM | POA: Diagnosis not present

## 2021-10-19 DIAGNOSIS — N139 Obstructive and reflux uropathy, unspecified: Secondary | ICD-10-CM | POA: Insufficient documentation

## 2021-10-19 DIAGNOSIS — R7303 Prediabetes: Secondary | ICD-10-CM

## 2021-10-19 DIAGNOSIS — E6609 Other obesity due to excess calories: Secondary | ICD-10-CM

## 2021-10-19 MED ORDER — TADALAFIL 5 MG PO TABS: 5.0000 mg | ORAL_TABLET | Freq: Every day | ORAL | 3 refills | Status: DC

## 2021-10-19 MED ORDER — ALBUTEROL SULFATE HFA 108 (90 BASE) MCG/ACT IN AERS: INHALATION_SPRAY | RESPIRATORY_TRACT | 11 refills | Status: DC

## 2021-10-19 NOTE — Progress Notes (Signed)
Subjective:    CC: Annual Physical Exam  HPI:  This patient is here for their annual physical  I reviewed the past medical history, family history, social history, surgical history, and allergies today and no changes were needed.  Please see the problem list section below in epic for further details.  Past Medical History: Past Medical History:  Diagnosis Date   Allergy    Asthma    Calcium nephrolithiasis    Hypogonadism in male    Obesity    Past Surgical History: Past Surgical History:  Procedure Laterality Date   NO PAST SURGERIES     Social History: Social History   Socioeconomic History   Marital status: Married    Spouse name: Not on file   Number of children: Not on file   Years of education: Not on file   Highest education level: Not on file  Occupational History   Not on file  Tobacco Use   Smoking status: Former   Smokeless tobacco: Never  Substance and Sexual Activity   Alcohol use: No   Drug use: No   Sexual activity: Yes  Other Topics Concern   Not on file  Social History Narrative   Not on file   Social Determinants of Health   Financial Resource Strain: Not on file  Food Insecurity: Not on file  Transportation Needs: Not on file  Physical Activity: Not on file  Stress: Not on file  Social Connections: Not on file   Family History: Family History  Problem Relation Age of Onset   Hypertension Unknown        parent   Allergies: No Known Allergies Medications: See med rec.  Review of Systems: No headache, visual changes, nausea, vomiting, diarrhea, constipation, dizziness, abdominal pain, skin rash, fevers, chills, night sweats, swollen lymph nodes, weight loss, chest pain, body aches, joint swelling, muscle aches, shortness of breath, mood changes, visual or auditory hallucinations.  Objective:    General: Well Developed, well nourished, and in no acute distress.  Neuro: Alert and oriented x3, extra-ocular muscles intact, sensation  grossly intact. Cranial nerves II through XII are intact, motor, sensory, and coordinative functions are all intact. HEENT: Normocephalic, atraumatic, pupils equal round reactive to light, neck supple, no masses, no lymphadenopathy, thyroid nonpalpable. Oropharynx, nasopharynx, external ear canals are unremarkable. Skin: Warm and dry, no rashes noted.  Cardiac: Regular rate and rhythm, no murmurs rubs or gallops.  Respiratory: Clear to auscultation bilaterally. Not using accessory muscles, speaking in full sentences.  Abdominal: Soft, nontender, nondistended, positive bowel sounds, no masses, no organomegaly.  Musculoskeletal: Shoulder, elbow, wrist, hip, knee, ankle stable, and with full range of motion.  Impression and Recommendations:    The patient was counselled, risk factors were discussed, anticipatory guidance given.  Annual physical exam This is a very pleasant 51 year old male, here for his routine physical, ordering routine labs today. Up-to-date on other screening measures. PHQ-9 = 6, overall he feels okay, he is working on some debt issues currently.  Benign essential hypertension Blood pressure is elevated, he will keep an eye on this at home.  Obstructive uropathy Is having some issues with incomplete emptying, stream is better when he does take Cialis. Adding a PSA to labs. We will switch to daily use Cialis, I will do #120 per 3 months we can use a few extra if needed.  Obesity Continues to struggle with his weight, did well with phentermine sometime ago, he is going to try on his own, and  he will let me know if he would like to try phentermine again. He also has a high consumption of carbonated beverages making Topamax another possible option.   ___________________________________________ Gwen Her. Dianah Field, M.D., ABFM., CAQSM. Primary Care and Sports Medicine [place] [facility]  Adjunct Professor of [place] of [facility] of [facility]

## 2021-10-19 NOTE — Assessment & Plan Note (Signed)
Continues to struggle with his weight, did well with phentermine sometime ago, he is going to try on his own, and he will let me know if he would like to try phentermine again. He also has a high consumption of carbonated beverages making Topamax another possible option.

## 2021-10-19 NOTE — Assessment & Plan Note (Signed)
Is having some issues with incomplete emptying, stream is better when he does take Cialis. Adding a PSA to labs. We will switch to daily use Cialis, I will do #120 per 3 months we can use a few extra if needed.

## 2021-10-19 NOTE — Assessment & Plan Note (Signed)
This is a very pleasant 51 year old male, here for his routine physical, ordering routine labs today. Up-to-date on other screening measures. PHQ-9 = 6, overall he feels okay, he is working on some debt issues currently.

## 2021-10-19 NOTE — Assessment & Plan Note (Signed)
Blood pressure is elevated, he will keep an eye on this at home.

## 2021-10-22 LAB — LIPID PANEL
Cholesterol: 139 mg/dL (ref <200)
HDL: 42 mg/dL (ref >=40)
LDL Cholesterol (Calc): 82 mg/dL (calc)
Non-HDL Cholesterol (Calc): 97 mg/dL (calc) (ref <130)
Total CHOL/HDL Ratio: 3.3 (calc) (ref <5.0)
Triglycerides: 66 mg/dL (ref <150)

## 2021-10-22 LAB — COMPLETE METABOLIC PANEL WITH GFR
AG Ratio: 1.5 (calc) (ref 1.0–2.5)
ALT: 25 U/L (ref 9–46)
AST: 19 U/L (ref 10–40)
Albumin: 4.6 g/dL (ref 3.6–5.1)
Alkaline phosphatase (APISO): 86 U/L (ref 36–130)
BUN: 10 mg/dL (ref 7–25)
CO2: 25 mmol/L (ref 20–32)
Calcium: 9.9 mg/dL (ref 8.6–10.3)
Chloride: 106 mmol/L (ref 98–110)
Creat: 1.03 mg/dL (ref 0.60–1.29)
Globulin: 3 g/dL (calc) (ref 1.9–3.7)
Glucose, Bld: 98 mg/dL (ref 65–99)
Potassium: 4.2 mmol/L (ref 3.5–5.3)
Sodium: 142 mmol/L (ref 135–146)
Total Bilirubin: 1 mg/dL (ref 0.2–1.2)
Total Protein: 7.6 g/dL (ref 6.1–8.1)
eGFR: 89 mL/min/{1.73_m2} (ref >=60)

## 2021-10-22 LAB — PSA, TOTAL AND FREE
PSA, % Free: 42 % (calc) (ref >25)
PSA, Free: 0.8 ng/mL
PSA, Total: 1.9 ng/mL (ref <=4.0)

## 2021-10-22 LAB — CBC
HCT: 47.4 % (ref 38.5–50.0)
Hemoglobin: 16.4 g/dL (ref 13.2–17.1)
MCH: 30 pg (ref 27.0–33.0)
MCHC: 34.6 g/dL (ref 32.0–36.0)
MCV: 86.7 fL (ref 80.0–100.0)
MPV: 12 fL (ref 7.5–12.5)
Platelets: 181 10*3/uL (ref 140–400)
RBC: 5.47 10*6/uL (ref 4.20–5.80)
RDW: 14.9 % (ref 11.0–15.0)
WBC: 7 10*3/uL (ref 3.8–10.8)

## 2021-10-22 LAB — HEMOGLOBIN A1C
Hgb A1c MFr Bld: 5.6 % of total Hgb (ref <5.7)
Mean Plasma Glucose: 114 mg/dL
eAG (mmol/L): 6.3 mmol/L

## 2021-10-22 LAB — TSH: TSH: 1.63 mIU/L (ref 0.40–4.50)

## 2022-03-29 ENCOUNTER — Other Ambulatory Visit: Payer: Self-pay

## 2022-03-29 ENCOUNTER — Other Ambulatory Visit: Payer: Self-pay | Admitting: Sports Medicine

## 2022-03-29 DIAGNOSIS — E785 Hyperlipidemia, unspecified: Secondary | ICD-10-CM

## 2022-03-29 MED ORDER — ATORVASTATIN CALCIUM 20 MG PO TABS: 20.0000 mg | ORAL_TABLET | Freq: Every day | ORAL | 2 refills | Status: DC

## 2022-08-05 ENCOUNTER — Other Ambulatory Visit: Payer: Self-pay | Admitting: Pharmacist

## 2022-08-05 NOTE — Patient Instructions (Signed)
Tommi Rumps,  Thank you for speaking with me today. As discussed, check blood pressure at home and write down a few numbers to help Dr. Darene Lamer better assess your blood pressure at your upcoming appointment in May. If your arm pains persist, feel free to call the office at [phone] to get a sooner appointment.  Below are some helpful tips with checking blood pressure at home:  Check your blood pressure periodically, and any time you have concerning symptoms like headache, chest pain, dizziness, shortness of breath, or vision changes.   Our goal is less than 140/90.  To appropriately check your blood pressure, make sure you do the following:  1) Avoid caffeine, exercise, or tobacco products for 30 minutes before checking. Empty your bladder. 2) Sit with your back supported in a flat-backed chair. Rest your arm on something flat (arm of the chair, table, etc). 3) Sit still with your feet flat on the floor, resting, for at least 5 minutes.  4) Check your blood pressure. Take 1-2 readings.  5) Write down these readings and bring with you to any provider appointments.  Bring your home blood pressure machine with you to a provider's office for accuracy comparison at least once a year.   Take care, Luana Shu, PharmD Clinical Pharmacist [facility] [phone]

## 2022-08-05 NOTE — Progress Notes (Signed)
Patient appearing on report for [facility] - Hypertension Control report due to last documented ambulatory blood pressure of 144/92 on 10/19/21. Next appointment with PCP is 10/21/22   Outreached patient to discuss hypertension control and medication management.   Current antihypertensives: none currently  Patient has an automated upper arm home BP machine.  Current blood pressure readings: checking periodically but has been   Patient denies hypotensive signs and symptoms including dizziness, lightheadedness.  Patient denies hypertensive symptoms including headache, chest pain, shortness of breath.  Patient reports some occasional arm pains.   Assessment/Plan: - Currently unknown control - - Reviewed goal blood pressure <140/90 - Reviewed appropriate home BP monitoring technique (avoid caffeine, smoking, and exercise for 30 minutes before checking, rest for at least 5 minutes before taking BP, sit with feet flat on the floor and back against a hard surface, uncross legs, and rest arm on flat surface) - Recommend checking blood pressure and documenting numbers at home to provide at upcoming PCP visit, schedule sooner appointment if arm pains persist.  Larinda Buttery, PharmD Clinical Pharmacist [facility] [phone]

## 2022-10-21 ENCOUNTER — Encounter: Payer: Self-pay | Admitting: Sports Medicine

## 2022-10-21 ENCOUNTER — Ambulatory Visit (INDEPENDENT_AMBULATORY_CARE_PROVIDER_SITE_OTHER): Payer: BC Managed Care – PPO | Admitting: Sports Medicine

## 2022-10-21 VITALS — BP 130/80 | HR 79 | Ht 68.5 in | Wt 244.0 lb

## 2022-10-21 DIAGNOSIS — N139 Obstructive and reflux uropathy, unspecified: Secondary | ICD-10-CM

## 2022-10-21 DIAGNOSIS — Z0000 Encounter for general adult medical examination without abnormal findings: Secondary | ICD-10-CM

## 2022-10-21 DIAGNOSIS — L918 Other hypertrophic disorders of the skin: Secondary | ICD-10-CM

## 2022-10-21 DIAGNOSIS — E538 Deficiency of other specified B group vitamins: Secondary | ICD-10-CM

## 2022-10-21 DIAGNOSIS — E785 Hyperlipidemia, unspecified: Secondary | ICD-10-CM

## 2022-10-21 DIAGNOSIS — E559 Vitamin D deficiency, unspecified: Secondary | ICD-10-CM | POA: Diagnosis not present

## 2022-10-21 DIAGNOSIS — I10 Essential (primary) hypertension: Secondary | ICD-10-CM | POA: Diagnosis not present

## 2022-10-21 DIAGNOSIS — Z23 Encounter for immunization: Secondary | ICD-10-CM

## 2022-10-21 NOTE — Assessment & Plan Note (Addendum)
Cryotherapy of 17 neck skin tags.

## 2022-10-21 NOTE — Progress Notes (Signed)
Subjective:    CC: Annual Physical Exam  HPI:  This patient is here for their annual physical  I reviewed the past medical history, family history, social history, surgical history, and allergies today and no changes were needed.  Please see the problem list section below in epic for further details.  Past Medical History: Past Medical History:  Diagnosis Date   Allergy    Asthma    Calcium nephrolithiasis    Hypogonadism in male    Obesity    Past Surgical History: Past Surgical History:  Procedure Laterality Date   NO PAST SURGERIES     Social History: Social History   Socioeconomic History   Marital status: Married    Spouse name: Not on file   Number of children: Not on file   Years of education: Not on file   Highest education level: Not on file  Occupational History   Not on file  Tobacco Use   Smoking status: Former   Smokeless tobacco: Never  Substance and Sexual Activity   Alcohol use: No   Drug use: No   Sexual activity: Yes  Other Topics Concern   Not on file  Social History Narrative   Not on file   Social Determinants of Health   Financial Resource Strain: Not on file  Food Insecurity: Not on file  Transportation Needs: Not on file  Physical Activity: Not on file  Stress: Not on file  Social Connections: Not on file   Family History: Family History  Problem Relation Age of Onset   Hypertension Unknown        parent   Allergies: No Known Allergies Medications: See med rec.  Review of Systems: No headache, visual changes, nausea, vomiting, diarrhea, constipation, dizziness, abdominal pain, skin rash, fevers, chills, night sweats, swollen lymph nodes, weight loss, chest pain, body aches, joint swelling, muscle aches, shortness of breath, mood changes, visual or auditory hallucinations.  Objective:    General: Well Developed, well nourished, and in no acute distress.  Neuro: Alert and oriented x3, extra-ocular muscles intact, sensation  grossly intact. Cranial nerves II through XII are intact, motor, sensory, and coordinative functions are all intact. HEENT: Normocephalic, atraumatic, pupils equal round reactive to light, neck supple, no masses, no lymphadenopathy, thyroid nonpalpable. Oropharynx, nasopharynx, external ear canals are unremarkable. Skin: Warm and dry, no rashes noted.  Multiple skin tags around the neck Cardiac: Regular rate and rhythm, no murmurs rubs or gallops.  Respiratory: Clear to auscultation bilaterally. Not using accessory muscles, speaking in full sentences.  Abdominal: Soft, nontender, nondistended, positive bowel sounds, no masses, no organomegaly.  Musculoskeletal: Shoulder, elbow, wrist, hip, knee, ankle stable, and with full range of motion.  Procedure:  Cryodestruction of #17 skin tags around the neck Consent obtained and verified. Time-out conducted. Noted no overlying erythema, induration, or other signs of local infection. Completed without difficulty using Cryo-Gun. Advised to call if fevers/chills, erythema, induration, drainage, or persistent bleeding.  Impression and Recommendations:    The patient was counselled, risk factors were discussed, anticipatory guidance given.  Annual physical exam Annual physical as above, Tdap today. He will think about Shingrix. We discussed more of a ketogenic type diet to help him with weight loss.  Skin tag Cryotherapy of 17 neck skin tags.  Benign essential hypertension Blood pressure slightly elevated in the office and on recheck but runs in the 130s over 80s at home, I think there is a whitecoat component and as he continues to lose weight  the blood pressure will drop, we will avoid adding medication for now.   ____________________________________________ [name]. Benjamin Stain, M.D., ABFM., CAQSM., AME. Primary Care and Sports Medicine [place] [facility]  Adjunct Professor of Family Medicine  [place] of [employer]  Restaurant manager, fast food

## 2022-10-21 NOTE — Assessment & Plan Note (Addendum)
Annual physical as above, Tdap today. He will think about Shingrix. We discussed more of a ketogenic type diet to help him with weight loss.

## 2022-10-21 NOTE — Assessment & Plan Note (Signed)
Blood pressure slightly elevated in the office and on recheck but runs in the 130s over 80s at home, I think there is a whitecoat component and as he continues to lose weight the blood pressure will drop, we will avoid adding medication for now.

## 2022-10-22 LAB — HEMOGLOBIN A1C
Hgb A1c MFr Bld: 5.7 % of total Hgb — ABNORMAL HIGH (ref <5.7)
Mean Plasma Glucose: 117 mg/dL

## 2022-10-22 LAB — COMPREHENSIVE METABOLIC PANEL
ALT: 30 U/L (ref 9–46)
Chloride: 107 mmol/L (ref 98–110)
Total Bilirubin: 1 mg/dL (ref 0.2–1.2)

## 2022-10-23 LAB — CBC
HCT: 43.7 % (ref 38.5–50.0)
Hemoglobin: 14.9 g/dL (ref 13.2–17.1)
MCH: 29.4 pg (ref 27.0–33.0)
MCHC: 34.1 g/dL (ref 32.0–36.0)
MCV: 86.4 fL (ref 80.0–100.0)
MPV: 12 fL (ref 7.5–12.5)
Platelets: 182 Thousand/uL (ref 140–400)
RBC: 5.06 10*6/uL (ref 4.20–5.80)
RDW: 15.2 % — ABNORMAL HIGH (ref 11.0–15.0)
WBC: 6.6 Thousand/uL (ref 3.8–10.8)

## 2022-10-23 LAB — VITAMIN D 25 HYDROXY (VIT D DEFICIENCY, FRACTURES): Vit D, 25-Hydroxy: 31 ng/mL (ref 30–100)

## 2022-10-23 LAB — LIPID PANEL
Cholesterol: 128 mg/dL (ref <200)
HDL: 44 mg/dL (ref >=40)
LDL Cholesterol (Calc): 70 mg/dL (calc)
Non-HDL Cholesterol (Calc): 84 mg/dL (ref <130)
Total CHOL/HDL Ratio: 2.9 (calc) (ref <5.0)
Triglycerides: 55 mg/dL (ref <150)

## 2022-10-23 LAB — COMPREHENSIVE METABOLIC PANEL WITH GFR
AST: 20 U/L (ref 10–35)
BUN: 12 mg/dL (ref 7–25)
Calcium: 9.7 mg/dL (ref 8.6–10.3)
Globulin: 2.7 g/dL (ref 1.9–3.7)
Glucose, Bld: 106 mg/dL — ABNORMAL HIGH (ref 65–99)
Sodium: 143 mmol/L (ref 135–146)

## 2022-10-23 LAB — PSA, TOTAL AND FREE
PSA, % Free: 33 % (calc) (ref >25)
PSA, Free: 0.7 ng/mL
PSA, Total: 2.1 ng/mL (ref <=4.0)

## 2022-10-23 LAB — TSH: TSH: 1.54 m[IU]/L (ref 0.40–4.50)

## 2022-10-23 LAB — HEMOGLOBIN A1C: eAG (mmol/L): 6.5 mmol/L

## 2022-10-23 LAB — COMPREHENSIVE METABOLIC PANEL
AG Ratio: 1.7 (calc) (ref 1.0–2.5)
Albumin: 4.7 g/dL (ref 3.6–5.1)
Alkaline phosphatase (APISO): 69 U/L (ref 35–144)
CO2: 28 mmol/L (ref 20–32)
Creat: 0.96 mg/dL (ref 0.70–1.30)
Potassium: 4.3 mmol/L (ref 3.5–5.3)
Total Protein: 7.4 g/dL (ref 6.1–8.1)

## 2022-11-19 ENCOUNTER — Ambulatory Visit (INDEPENDENT_AMBULATORY_CARE_PROVIDER_SITE_OTHER): Payer: BC Managed Care – PPO | Admitting: Sports Medicine

## 2022-11-19 DIAGNOSIS — L918 Other hypertrophic disorders of the skin: Secondary | ICD-10-CM

## 2022-11-19 NOTE — Assessment & Plan Note (Signed)
1 month ago we did cryotherapy and 17 skin tags around the neck, he returns today, most have improved/resolved, he does have a few areas of postinflammatory hypopigmentation which will resolve, we performed cryotherapy on 5 additional skin tags that did not fully resolve, return to see me in a month as needed.

## 2022-11-19 NOTE — Progress Notes (Signed)
    Procedures performed today:    Procedure:  Cryodestruction of approximately 5 neck skin tags Consent obtained and verified. Time-out conducted. Noted no overlying erythema, induration, or other signs of local infection. Completed without difficulty using Cryo-Gun. Advised to call if fevers/chills, erythema, induration, drainage, or persistent bleeding.  Independent interpretation of notes and tests performed by another provider:   None.  Brief History, Exam, Impression, and Recommendations:    Skin tag 1 month ago we did cryotherapy and 17 skin tags around the neck, he returns today, most have improved/resolved, he does have a few areas of postinflammatory hypopigmentation which will resolve, we performed cryotherapy on 5 additional skin tags that did not fully resolve, return to see me in a month as needed.    ____________________________________________ [name]. Benjamin Stain, M.D., ABFM., CAQSM., AME. Primary Care and Sports Medicine [place] [facility]  Adjunct Professor of Family Medicine  [place] of [facility] of Medicine  Restaurant manager, fast food

## 2022-12-24 ENCOUNTER — Ambulatory Visit: Payer: BC Managed Care – PPO | Admitting: Sports Medicine

## 2022-12-24 DIAGNOSIS — T63441A Toxic effect of venom of bees, accidental (unintentional), initial encounter: Secondary | ICD-10-CM | POA: Insufficient documentation

## 2022-12-24 DIAGNOSIS — D1801 Hemangioma of skin and subcutaneous tissue: Secondary | ICD-10-CM | POA: Diagnosis not present

## 2022-12-24 MED ORDER — EPINEPHRINE 0.3 MG/0.3ML IJ SOAJ
0.3000 mg | INTRAMUSCULAR | 11 refills | Status: AC | PRN
Start: 2022-12-24 — End: ?

## 2022-12-24 NOTE — Assessment & Plan Note (Signed)
1 month hemangioma upper lip, referral to dermatology.

## 2022-12-24 NOTE — Assessment & Plan Note (Signed)
Jose Rojas was recently stung by some bees, he has been stung before with only minimal reaction, this time he had a significant type I hypersensitivity reaction with significant swelling of his head, parts of his face, hand. No mucocutaneous or pulmonary involvement, no shortness of breath, treated in urgent care with steroids and has improved. Due to the dramatic nature of his reaction I would like him to carry around an EpiPen.

## 2022-12-24 NOTE — Progress Notes (Signed)
    Procedures performed today:    None.  Independent interpretation of notes and tests performed by another provider:   None.  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  Bee sting reaction Jose Rojas was recently stung by some bees, he has been stung before with only minimal reaction, this time he had a significant type I hypersensitivity reaction with significant swelling of his head, parts of his face, hand. No mucocutaneous or pulmonary involvement, no shortness of breath, treated in urgent care with steroids and has improved. Due to the dramatic nature of his reaction I would like him to carry around an EpiPen.    ____________________________________________ [name]. Benjamin Stain, M.D., ABFM., CAQSM., AME. Primary Care and Sports Medicine [place] [facility]  Adjunct Professor of Family Medicine  [place] [employer] [facility] of Medicine  Restaurant manager, fast food

## 2023-01-27 ENCOUNTER — Other Ambulatory Visit: Payer: Self-pay | Admitting: Sports Medicine

## 2023-01-27 DIAGNOSIS — N139 Obstructive and reflux uropathy, unspecified: Secondary | ICD-10-CM

## 2023-01-29 ENCOUNTER — Telehealth: Payer: Self-pay | Admitting: Sports Medicine

## 2023-01-29 NOTE — Telephone Encounter (Signed)
Prescription Request  01/29/2023  LOV: 12/24/2022  What is the name of the medication or equipment? Cialis 5mg   Have you contacted your pharmacy to request a refill? Yes   Which pharmacy would you like this sent to? [facility] [place]  Patient notified that their request is being sent to the clinical staff for review and that they should receive a response within 2 business days.   Please advise at Mobile [phone] (mobile)

## 2023-01-29 NOTE — Telephone Encounter (Signed)
I already sent this to [facility] in [place] yesterday.

## 2023-03-03 ENCOUNTER — Other Ambulatory Visit: Payer: Self-pay | Admitting: Sports Medicine

## 2023-03-03 DIAGNOSIS — E785 Hyperlipidemia, unspecified: Secondary | ICD-10-CM

## 2023-09-29 ENCOUNTER — Other Ambulatory Visit: Payer: Self-pay

## 2024-01-23 ENCOUNTER — Other Ambulatory Visit: Payer: Self-pay

## 2024-01-23 ENCOUNTER — Ambulatory Visit: Admitting: Sports Medicine

## 2024-01-23 ENCOUNTER — Ambulatory Visit (INDEPENDENT_AMBULATORY_CARE_PROVIDER_SITE_OTHER)

## 2024-01-23 DIAGNOSIS — M25561 Pain in right knee: Secondary | ICD-10-CM | POA: Diagnosis not present

## 2024-01-23 DIAGNOSIS — S83207A Unspecified tear of unspecified meniscus, current injury, left knee, initial encounter: Secondary | ICD-10-CM | POA: Insufficient documentation

## 2024-01-23 DIAGNOSIS — M25562 Pain in left knee: Secondary | ICD-10-CM

## 2024-01-23 MED ORDER — TRIAMCINOLONE ACETONIDE 40 MG/ML IJ SUSP
40.0000 mg | Freq: Once | INTRAMUSCULAR | Status: AC
Start: 2024-01-23 — End: 2024-01-23
  Administered 2024-01-23: 40 mg via INTRA_ARTICULAR

## 2024-01-23 NOTE — Assessment & Plan Note (Signed)
 Very pleasant 53 year old male, he was playing basketball with some kids, he got juked and injured his left knee. He had immediate pain, swelling, difficulty bending the knee. This occurred about 10 weeks ago. Continues to have discomfort in spite of oral analgesics. On exam he has an effusion, tenderness at the joint line, positive McMurray's sign. Suspect meniscal tearing, we did an aspiration and injection today, I would like x-rays, an MRI, he will do home physical therapy, and we will have him do a knee sleeve. Return to see me in 4-6 weeks.

## 2024-01-23 NOTE — Progress Notes (Signed)
    Procedures performed today:    Procedure: Real-time Ultrasound Guided aspiration/injection of left knee Device: Samsung HS60  Verbal informed consent obtained.  Time-out conducted.  Noted no overlying erythema, induration, or other signs of local infection.  Skin prepped in a sterile fashion.  Local anesthesia: Topical Ethyl chloride.  With sterile technique and under real time ultrasound guidance: Using an 18-gauge needle noted an effusion, isoechoic, we advanced into this and attempted to aspirate, we were unable to gain any aspirate suggesting that this was clotted hemarthrosis, syringe switched and 1 cc Kenalog  40, 2 cc lidocaine, 2 cc bupivacaine injected easily Completed without difficulty  Advised to call if fevers/chills, erythema, induration, drainage, or persistent bleeding.  Images permanently stored and available for review in PACS.  Impression: Technically successful ultrasound guided attempted aspiration/successful injection.  Independent interpretation of notes and tests performed by another provider:   None.  Brief History, Exam, Impression, and Recommendations:    Acute meniscal tear of left knee Very pleasant 53 year old male, he was playing basketball with some kids, he got juked and injured his left knee. He had immediate pain, swelling, difficulty bending the knee. This occurred about 10 weeks ago. Continues to have discomfort in spite of oral analgesics. On exam he has an effusion, tenderness at the joint line, positive McMurray's sign. Suspect meniscal tearing, we did an aspiration and injection today, I would like x-rays, an MRI, he will do home physical therapy, and we will have him do a knee sleeve. Return to see me in 4-6 weeks.    ____________________________________________ Debby PARAS. Curtis, M.D., ABFM., CAQSM., AME. Primary Care and Sports Medicine [place] [facility]  Adjunct Professor of [facility] Medicine  [employer]  Restaurant manager, fast food

## 2024-01-23 NOTE — Patient Instructions (Signed)
 SABRA

## 2024-01-23 NOTE — Addendum Note (Signed)
 Addended by: OLEY CHIQUITA CROME on: 01/23/2024 11:42 AM   Modules accepted: Orders

## 2024-02-01 ENCOUNTER — Ambulatory Visit

## 2024-02-01 DIAGNOSIS — M25562 Pain in left knee: Secondary | ICD-10-CM

## 2024-02-01 DIAGNOSIS — S83207A Unspecified tear of unspecified meniscus, current injury, left knee, initial encounter: Secondary | ICD-10-CM

## 2024-02-03 ENCOUNTER — Encounter: Payer: Self-pay | Admitting: Sports Medicine

## 2024-02-25 ENCOUNTER — Encounter: Admitting: Sports Medicine

## 2024-02-25 ENCOUNTER — Encounter: Admitting: Urgent Care

## 2024-03-04 ENCOUNTER — Encounter: Payer: Self-pay | Admitting: Urgent Care

## 2024-03-04 ENCOUNTER — Ambulatory Visit (INDEPENDENT_AMBULATORY_CARE_PROVIDER_SITE_OTHER)

## 2024-03-04 ENCOUNTER — Ambulatory Visit (INDEPENDENT_AMBULATORY_CARE_PROVIDER_SITE_OTHER): Admitting: Urgent Care

## 2024-03-04 VITALS — BP 140/80 | Ht 68.0 in | Wt 244.0 lb

## 2024-03-04 VITALS — BP 150/110 | HR 63 | Wt 244.0 lb

## 2024-03-04 DIAGNOSIS — E538 Deficiency of other specified B group vitamins: Secondary | ICD-10-CM

## 2024-03-04 DIAGNOSIS — R7309 Other abnormal glucose: Secondary | ICD-10-CM

## 2024-03-04 DIAGNOSIS — I10 Essential (primary) hypertension: Secondary | ICD-10-CM

## 2024-03-04 DIAGNOSIS — Z23 Encounter for immunization: Secondary | ICD-10-CM

## 2024-03-04 DIAGNOSIS — S83242A Other tear of medial meniscus, current injury, left knee, initial encounter: Secondary | ICD-10-CM | POA: Diagnosis not present

## 2024-03-04 DIAGNOSIS — Z0000 Encounter for general adult medical examination without abnormal findings: Secondary | ICD-10-CM | POA: Diagnosis not present

## 2024-03-04 DIAGNOSIS — L83 Acanthosis nigricans: Secondary | ICD-10-CM

## 2024-03-04 DIAGNOSIS — E785 Hyperlipidemia, unspecified: Secondary | ICD-10-CM

## 2024-03-04 DIAGNOSIS — E559 Vitamin D deficiency, unspecified: Secondary | ICD-10-CM | POA: Diagnosis not present

## 2024-03-04 DIAGNOSIS — J302 Other seasonal allergic rhinitis: Secondary | ICD-10-CM

## 2024-03-04 DIAGNOSIS — Z125 Encounter for screening for malignant neoplasm of prostate: Secondary | ICD-10-CM

## 2024-03-04 DIAGNOSIS — Z1211 Encounter for screening for malignant neoplasm of colon: Secondary | ICD-10-CM

## 2024-03-04 NOTE — Progress Notes (Signed)
   Subjective:    Patient ID: TRAVONTAE FREIBERGER, male    DOB: 53 y.o., 1970/12/08   MRN: [id number]  HPI  Chief Complaint: Left knee MRI follow-up  02/01/2024 left knee MRI: FINDINGS: Bones: There is moderate tricompartmental osteoarthrosis with significant chondromalacia the medial compartment. Subchondral cyst are identified in the medial and lateral posterior tibial plateau. Moderate patellofemoral arthrosis is seen chondromalacia of the trochlea. There is no fracture or contusion pattern. There is slight lateral translation the patella. Small reactive joint effusion is present. The extensor mechanism is intact. There is mild tendinosis is suspected at the insertion of the quadriceps tendon. There is mild inflammation in the suprapatellar fat pad which can be seen with anterior suprapatellar fat pad impingement.   Ligaments: There is increased signal in the cruciate ligaments without evidence of tear. Medial collateral ligament and fibular collateral ligaments are intact.   Menisci: Tear of the posterior horn and body of the medial meniscus with a probable flipped fragment into the central joint best seen on sagittal image 12 and coronal image 10 through 13.   IMPRESSION: Tricompartmental osteoarthrosis most notably the medial compartment. There is chondromalacia. Mild subchondral cystic changes are seen in the posterior tibial plateau. There is a small reactive joint effusion.   There is a displaced tear of the posterior horn and body of the medial meniscus with a centrally displaced flap. Correlation for bucket-handle type symptoms.   Inflammation of the suprapatellar fat pad which can be seen with suprapatellar fat pad impingement.   Mild tendinosis at the insertion of the quadriceps mechanism. Slight lateral translation of the patella.     Objective:   Physical Exam Vitals:   03/04/24 1017  BP: (!) 140/80   Const: appears well, non-toxic, well groomed Psych: affect bright,  interactive, smiling EENT: EOMI intact, conjunctiva appear normal Neck: no obvious masses, appears symmetric Resp: non-labored, appears symmetric Neuro: muscle bulk appears normal Skin: no obvious rashes noted  Physical exam of the right knee today was deferred     Assessment & Plan:   Wynton is a 53 y.o. male with a prominent tear of the inner portion of the medial meniscus suspicious for a bucket-handle lesion as well as quadriceps tendinosis and inflammation of the suprapatellar fat pad.  He reports 55% improvement in his pain following an intra-articular corticosteroid injection 4 weeks ago without any additional treatments since.  We discussed the nature of his MRI findings as well as the treatment options available to him.  I feel that he may benefit from an arthroscopic meniscectomy, however given that there is compromise to the chondral surface of his knee joint, attempting physical therapy versus likely a much more prudent option, particular if he has achieved for 55% improvement with steroid injection alone.  Referral placed for PT, plan for follow-up in 4 weeks.

## 2024-03-04 NOTE — Patient Instructions (Signed)
 We completed your annual today. Vaccines updated. Please complete your cologuard test.  You can stop your cholesterol medication but we will need to repeat the test in 6 months.  Please use your BP cuff and monitor once daily. Write it down. Bring your home BP readings back along with your home BP cuff to a follow up visit in 2-3 weeks. Try to cut back on salt in the diet. Look at the Mediterranean diet to help with cholesterol levels.  Read the attached handout.

## 2024-03-04 NOTE — Progress Notes (Unsigned)
 Complete physical exam  Patient: Jose Rojas   DOB: Oct 30, 1970   53 y.o. Male  MRN: [id number]  Subjective:    Chief Complaint  Patient presents with   Annual Exam    FASTING    Jose Rojas is a 53 y.o. male who presents today for a complete physical exam. He reports consuming a lower carb diet. Has been trying to walk 30 minutes daily. He generally feels well. He reports sleeping well. He does have additional problems to discuss today.   Discussed the use of AI scribe software for clinical note transcription with the patient, who gave verbal consent to proceed.  History of Present Illness   Jose Rojas is a 53 year old male with hypertension and hyperlipidemia who presents for a follow-up on blood pressure management and cholesterol medication review.  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 His blood pressure tends to rise with weight gain. He is not currently on any medication for hypertension and is attempting to manage it through lifestyle changes, including dietary modifications and increased physical activity. He has started eating better, avoiding sugars and starches, and drinking more water. He is also trying to walk daily, although he has only managed to do so twice this week. In the past, weight loss has successfully lowered his blood pressure.  He has a history of hyperlipidemia and has been on Lipitor 20 mg for the past three to four years. He wants to discontinue the medication, not due to side effects, but because he prefers not to be on medication.  He has a history of prediabetes, with an A1c of 5.7% recorded a year ago. He has not had issues with blood sugar since then.  He uses a CPAP machine nightly for sleep apnea and reports good sleep quality. He believes weight loss might help him discontinue its use, although he acknowledges that his sleep apnea might not be solely  weight-related.  He takes Singulair  seasonally during pollen season for allergies and uses an inhaler as needed. He also has an EpiPen  available. He uses Cialis  5 mg as needed for erectile dysfunction, although he reports occasional headaches with its use.  He is a [employer] and typically eats out, opting for subs or salads and drinking water. He is trying to avoid juice and soda to aid in weight loss.       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   Bee sting reaction 12/24/2022   Obstructive uropathy 10/19/2021   Prediabetes 08/13/2019   Dermatitis 07/09/2019   Midepigastric pain 06/16/2019   Benign essential hypertension 03/28/2017   Hyperlipidemia LDL goal <100 03/28/2017   History of nephrolithiasis, renal cyst 10/07/2016   Migraine headache 12/25/2015   Obesity 12/25/2015   Erectile dysfunction 11/08/2014   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   Asthma, chronic 07/15/2012   Male hypogonadism 07/15/2012  Annual physical exam 07/09/2012   Skin tag 07/09/2012   Seasonal allergies 07/09/2012   Past Medical History:  Diagnosis Date   Allergy    Asthma    Calcium  nephrolithiasis    Hypogonadism in male    Obesity    Oxygen deficiency 2023   Past Surgical History:  Procedure Laterality Date   NO PAST SURGERIES     Social History   Tobacco Use   Smoking status: Former   Smokeless tobacco: Never  Substance Use Topics   Alcohol use: No   Drug use: No      Patient Care Team: Lowella Benton CROME, PA as PCP - General (Physician Assistant)   Outpatient Medications  Prior to Visit  Medication Sig   albuterol  (VENTOLIN  HFA) 108 (90 Base) MCG/ACT inhaler INHALE 1 TO 2 PUFFS BY MOUTH EVERY 6 HOURS AS NEEDED FOR WHEEZING OR SHORTNESS OF BREATH   atorvastatin  (LIPITOR) 20 MG tablet TAKE 1 TABLET(20 MG) BY MOUTH DAILY   EPINEPHrine  (EPIPEN  2-PAK) 0.3 mg/0.3 mL IJ SOAJ injection Inject 0.3 mg into the muscle as needed for anaphylaxis.   montelukast  (SINGULAIR ) 10 MG tablet Take 1 tablet (10 mg total) by mouth at bedtime.   tadalafil  (CIALIS ) 5 MG tablet TAKE 1 TABLET BY MOUTH DAILY AS NEEDED   No facility-administered medications prior to visit.    ROS Complete 12 point ROS performed with all pertinent positives listed in HPI      Objective:     BP (!) 150/110   Pulse 63   Wt 244 lb (110.7 kg)   SpO2 100%   BMI 36.56 kg/m  BP Readings from Last 3 Encounters:  03/04/24 (!) 140/80  03/04/24 (!) 150/110  10/21/22 130/80   Wt Readings from Last 3 Encounters:  03/04/24 244 lb (110.7 kg)  03/04/24 244 lb (110.7 kg)  10/21/22 244 lb (110.7 kg)      Physical Exam Vitals and nursing note reviewed.  Constitutional:      General: He is not in acute distress.    Appearance: Normal appearance. He is not ill-appearing, toxic-appearing or diaphoretic.  HENT:     Head: Normocephalic and atraumatic.     Right Ear: Tympanic membrane, ear canal and external ear normal. There is no impacted cerumen.     Left Ear: Tympanic membrane, ear canal and external ear normal. There is no impacted cerumen.     Nose: Nose normal.     Mouth/Throat:     Mouth: Mucous membranes are moist.     Pharynx: Oropharynx is clear. No oropharyngeal exudate or posterior oropharyngeal erythema.     Comments: Mallampati score 3 Eyes:     General: No scleral icterus.       Right eye: No discharge.        Left eye: No discharge.     Extraocular Movements: Extraocular movements intact.     Pupils: Pupils are equal, round, and reactive to light.  Neck:     Thyroid: No thyroid  mass, thyromegaly or thyroid tenderness.  Cardiovascular:     Rate and Rhythm: Normal rate and regular rhythm.     Pulses: Normal pulses.     Heart sounds: No murmur heard. Pulmonary:     Effort: Pulmonary effort is normal. No respiratory distress.     Breath sounds: Normal breath sounds. No stridor. No wheezing or rhonchi.  Abdominal:     General: Abdomen is flat. Bowel sounds are normal. There is no distension.  Palpations: Abdomen is soft. There is no mass.     Tenderness: There is no abdominal tenderness. There is no guarding.  Musculoskeletal:     Cervical back: Normal range of motion and neck supple. No rigidity or tenderness.     Right lower leg: No edema.     Left lower leg: No edema.  Lymphadenopathy:     Cervical: No cervical adenopathy.  Skin:    General: Skin is warm and dry.     Coloration: Skin is not jaundiced.     Findings: Lesion (benign acrochordons to neck; acanthosis nigricans noted) present. No bruising, erythema or rash.  Neurological:     General: No focal deficit present.     Mental Status: He is alert and oriented to person, place, and time.     Sensory: No sensory deficit.     Motor: No weakness.  Psychiatric:        Mood and Affect: Mood normal.        Behavior: Behavior normal.      No results found for any visits on 03/04/24. Last CBC Lab Results  Component Value Date   WBC 6.6 10/21/2022   HGB 14.9 10/21/2022   HCT 43.7 10/21/2022   MCV 86.4 10/21/2022   MCH 29.4 10/21/2022   RDW 15.2 (H) 10/21/2022   PLT 182 10/21/2022   Last metabolic panel Lab Results  Component Value Date   GLUCOSE 106 (H) 10/21/2022   NA 143 10/21/2022   K 4.3 10/21/2022   CL 107 10/21/2022   CO2 28 10/21/2022   BUN 12 10/21/2022   CREATININE 0.96 10/21/2022   EGFR 89 10/19/2021   CALCIUM  9.7 10/21/2022   PROT 7.4 10/21/2022   ALBUMIN 4.6 12/25/2015   BILITOT 1.0 10/21/2022   ALKPHOS 84 10/04/2017   AST 20 10/21/2022   ALT 30 10/21/2022   Last  lipids Lab Results  Component Value Date   CHOL 128 10/21/2022   HDL 44 10/21/2022   LDLCALC 70 10/21/2022   TRIG 55 10/21/2022   CHOLHDL 2.9 10/21/2022   Last hemoglobin A1c Lab Results  Component Value Date   HGBA1C 5.7 (H) 10/21/2022   Last thyroid functions Lab Results  Component Value Date   TSH 1.54 10/21/2022   Last vitamin D  Lab Results  Component Value Date   VD25OH 31 10/21/2022   Last vitamin B12 and Folate No results found for: VITAMINB12, FOLATE      Assessment & Plan:    Routine Health Maintenance and Physical Exam  Immunization History  Administered Date(s) Administered   Influenza, Seasonal, Injecte, Preservative Fre 03/04/2024   Influenza,inj,Quad PF,6+ Mos 03/28/2017, 06/16/2019   PFIZER(Purple Top)SARS-COV-2 Vaccination 12/19/2019, 01/15/2020   Tdap 01/29/2013, 10/21/2022   Zoster Recombinant(Shingrix) 03/04/2024    Health Maintenance  Topic Date Due   Hepatitis C Screening  Never done   Pneumococcal Vaccine: 50+ Years (1 of 2 - PCV) Never done   Hepatitis B Vaccines 19-59 Average Risk (1 of 3 - 19+ 3-dose series) Never done   COVID-19 Vaccine (3 - Pfizer risk series) 02/12/2020   Fecal DNA (Cologuard)  09/20/2023   Zoster Vaccines- Shingrix (2 of 2) 04/29/2024   DTaP/Tdap/Td (3 - Td or Tdap) 10/20/2032   Influenza Vaccine  Completed   HIV Screening  Completed   HPV VACCINES  Aged Out   Meningococcal B Vaccine  Aged Out    Discussed health benefits of physical activity, and encouraged him to engage in regular exercise appropriate for  his age and condition.  Problem List Items Addressed This Visit     Seasonal allergies   Hyperlipidemia LDL goal <100   Relevant Orders   Lipid panel   Annual physical exam - Primary   Relevant Orders   CBC with Differential/Platelet   Hemoglobin A1c   TSH   Lipid panel   Comprehensive metabolic panel with GFR   PSA   Vitamin D  (25 hydroxy)   B12 and Folate Panel   Benign essential  hypertension   Relevant Orders   CBC with Differential/Platelet   TSH   Comprehensive metabolic panel with GFR   Other Visit Diagnoses       Vitamin B12 deficiency       Relevant Orders   B12 and Folate Panel     Vitamin D  deficiency       Relevant Orders   Vitamin D  (25 hydroxy)     Screening for malignant neoplasm of prostate       Relevant Orders   PSA     Abnormal glucose       Relevant Orders   Hemoglobin A1c     Acanthosis nigricans       Relevant Orders   Insulin, Free and Total     Flu vaccine need       Relevant Orders   Flu vaccine trivalent PF, 6mos and older(Flulaval,Afluria,Fluarix,Fluzone) (Completed)     Need for shingles vaccine       Relevant Orders   Varicella-zoster vaccine IM (Completed)     Screen for colon cancer       Relevant Orders   Cologuard      Return in about 2 weeks (around 03/18/2024).  Assessment and Plan    Adult Wellness Visit Routine wellness visit focused on weight loss and blood pressure management through dietary and lifestyle changes. - Administer flu vaccine. - Administer shingles vaccine. - Encourage dietary sodium reduction. - Encourage increased physical activity.  Essential hypertension Blood pressure 150/110 mmHg, fluctuates with weight changes. Managed with dietary sodium reduction and weight loss. - Monitor blood pressure at home daily for 2-3 weeks. - Bring home blood pressure cuff to next visit for comparison. - Consider switching tadalafil  to alfuzosin or doxazosin if blood pressure remains elevated.  Benign prostatic hyperplasia with lower urinary tract symptoms and erectile dysfunction Using tadalafil  5 mg as needed. Headaches reported with daily use. Discussed daily use and alternative medications for BPH and hypertension. - Consider switching tadalafil  to alfuzosin or doxazosin if blood pressure remains elevated.  Hyperlipidemia On atorvastatin  20 mg with well-controlled cholesterol. Discussed trial off  medication due to preference. ASCVD risk score 6.9% prior to statin, borderline for treatment. - Check baseline cholesterol level today. - Discontinue atorvastatin . - Recheck cholesterol in 6 months.  Acanthosis nigricans and prediabetes A1c 5.7. Acanthosis nigricans suggests possible insulin resistance. Discussed dietary modifications to prevent diabetes progression. - Order insulin resistance screening.  Obstructive sleep apnea Uses CPAP nightly with good compliance. Discussed weight loss impact on CPAP use, noting anatomical factors like Mallampati class 3 may contribute.  Seasonal allergic rhinitis Uses montelukast  seasonally during pollen season.  General Health Maintenance Routine health maintenance discussed. Due for pneumococcal vaccine. Encouraged regular eye and dental check-ups. - Administer pneumococcal vaccine at future visit. - Provide referral for new dentist.        Return in 2 weeks for BP recheck and lab review.    Benton LITTIE Gave, PA

## 2024-03-05 ENCOUNTER — Ambulatory Visit: Payer: Self-pay | Admitting: Urgent Care

## 2024-03-05 ENCOUNTER — Ambulatory Visit: Admitting: Sports Medicine

## 2024-03-05 ENCOUNTER — Encounter: Payer: Self-pay | Admitting: Urgent Care

## 2024-03-09 ENCOUNTER — Other Ambulatory Visit: Payer: Self-pay

## 2024-03-09 DIAGNOSIS — E785 Hyperlipidemia, unspecified: Secondary | ICD-10-CM

## 2024-03-09 MED ORDER — ATORVASTATIN CALCIUM 20 MG PO TABS: 20.0000 mg | ORAL_TABLET | Freq: Every day | ORAL | 2 refills | Status: AC

## 2024-03-13 LAB — B12 AND FOLATE PANEL
Folate: 10 ng/mL (ref >3.0)
Vitamin B-12: 854 pg/mL (ref 232–1245)

## 2024-03-13 LAB — LIPID PANEL
Chol/HDL Ratio: 3.5 ratio (ref 0.0–5.0)
Cholesterol, Total: 148 mg/dL (ref 100–199)
HDL: 42 mg/dL (ref >39)
LDL Chol Calc (NIH): 93 mg/dL (ref 0–99)
Triglycerides: 63 mg/dL (ref 0–149)
VLDL Cholesterol Cal: 13 mg/dL (ref 5–40)

## 2024-03-13 LAB — CBC WITH DIFFERENTIAL/PLATELET
Basophils Absolute: 0 x10E3/uL (ref 0.0–0.2)
Basos: 1 %
EOS (ABSOLUTE): 0.1 x10E3/uL (ref 0.0–0.4)
Eos: 2 %
Hematocrit: 46.9 % (ref 37.5–51.0)
Hemoglobin: 15.6 g/dL (ref 13.0–17.7)
Immature Grans (Abs): 0 x10E3/uL (ref 0.0–0.1)
Immature Granulocytes: 0 %
Lymphocytes Absolute: 1.7 x10E3/uL (ref 0.7–3.1)
Lymphs: 28 %
MCH: 29.8 pg (ref 26.6–33.0)
MCHC: 33.3 g/dL (ref 31.5–35.7)
MCV: 90 fL (ref 79–97)
Monocytes Absolute: 0.3 x10E3/uL (ref 0.1–0.9)
Monocytes: 5 %
Neutrophils Absolute: 4 x10E3/uL (ref 1.4–7.0)
Neutrophils: 64 %
Platelets: 184 x10E3/uL (ref 150–450)
RBC: 5.23 x10E6/uL (ref 4.14–5.80)
RDW: 14.4 % (ref 11.6–15.4)
WBC: 6.2 x10E3/uL (ref 3.4–10.8)

## 2024-03-13 LAB — COMPREHENSIVE METABOLIC PANEL WITH GFR
ALT: 22 IU/L (ref 0–44)
AST: 20 IU/L (ref 0–40)
Albumin: 4.6 g/dL (ref 3.8–4.9)
Alkaline Phosphatase: 90 IU/L (ref 47–123)
BUN/Creatinine Ratio: 10 (ref 9–20)
BUN: 9 mg/dL (ref 6–24)
Bilirubin Total: 0.7 mg/dL (ref 0.0–1.2)
CO2: 22 mmol/L (ref 20–29)
Calcium: 9.5 mg/dL (ref 8.7–10.2)
Chloride: 102 mmol/L (ref 96–106)
Creatinine, Ser: 0.94 mg/dL (ref 0.76–1.27)
Globulin, Total: 2.6 g/dL (ref 1.5–4.5)
Glucose: 110 mg/dL — ABNORMAL HIGH (ref 70–99)
Potassium: 4 mmol/L (ref 3.5–5.2)
Sodium: 141 mmol/L (ref 134–144)
Total Protein: 7.2 g/dL (ref 6.0–8.5)
eGFR: 98 mL/min/1.73 (ref >59)

## 2024-03-13 LAB — PSA: Prostate Specific Ag, Serum: 2.2 ng/mL (ref 0.0–4.0)

## 2024-03-13 LAB — HEMOGLOBIN A1C
Est. average glucose Bld gHb Est-mCnc: 114 mg/dL
Hgb A1c MFr Bld: 5.6 % (ref 4.8–5.6)

## 2024-03-13 LAB — VITAMIN D 25 HYDROXY (VIT D DEFICIENCY, FRACTURES): Vit D, 25-Hydroxy: 28.1 ng/mL — ABNORMAL LOW (ref 30.0–100.0)

## 2024-03-13 LAB — TSH: TSH: 2.01 u[IU]/mL (ref 0.450–4.500)

## 2024-03-13 LAB — INSULIN, FREE AND TOTAL
Free Insulin: 9.5 uU/mL
Total Insulin: 9.8 uU/mL

## 2024-03-27 LAB — COLOGUARD: COLOGUARD: NEGATIVE

## 2024-04-01 ENCOUNTER — Encounter: Payer: Self-pay | Admitting: Urgent Care

## 2024-04-01 ENCOUNTER — Ambulatory Visit: Admitting: Urgent Care

## 2024-04-01 VITALS — BP 180/120 | HR 69 | Ht 68.0 in | Wt 245.0 lb

## 2024-04-01 DIAGNOSIS — Z23 Encounter for immunization: Secondary | ICD-10-CM | POA: Diagnosis not present

## 2024-04-01 DIAGNOSIS — R3911 Hesitancy of micturition: Secondary | ICD-10-CM | POA: Diagnosis not present

## 2024-04-01 DIAGNOSIS — I10 Essential (primary) hypertension: Secondary | ICD-10-CM | POA: Diagnosis not present

## 2024-04-01 DIAGNOSIS — N401 Enlarged prostate with lower urinary tract symptoms: Secondary | ICD-10-CM

## 2024-04-01 DIAGNOSIS — Z6837 Body mass index (BMI) 37.0-37.9, adult: Secondary | ICD-10-CM

## 2024-04-01 DIAGNOSIS — R0609 Other forms of dyspnea: Secondary | ICD-10-CM

## 2024-04-01 MED ORDER — ALFUZOSIN HCL ER 10 MG PO TB24: 10.0000 mg | ORAL_TABLET | Freq: Every day | ORAL | 0 refills | Status: AC

## 2024-04-01 NOTE — Patient Instructions (Addendum)
 Return in 2 weeks for a nurse visit BP recheck. If BP remains elevated on your home BP machine, please return for an office visit with me to discuss.  Goal BP 120/80.  Take this medication daily in the morning.  Return in 3 months for office visit with me - come FASTING to check lipids

## 2024-04-01 NOTE — Progress Notes (Signed)
 Established Patient Office Visit  Subjective:  Patient ID: Jose Rojas, male    DOB: 01-Jan-1971  Age: 53 y.o. MRN: [id number]  Chief Complaint  Patient presents with   Follow-up    Blood pressure    HPI  Discussed the use of AI scribe software for clinical note transcription with the patient, who gave verbal consent to proceed.  History of Present Illness   Jose Rojas is a 53 year old male with hypertension and sleep apnea who presents with concerns about blood pressure management and urinary symptoms.  He experiences occasional headaches and shortness of breath, particularly when climbing stairs. The shortness of breath occurs mainly with incline. No chest pain or palpitations are associated with the shortness of breath. His CPAP machine, used for sleep apnea, may not fit properly, as it sometimes comes off during the night, potentially affecting sleep quality and blood pressure control.  He has a history of hypertension and notes that his blood pressure improved when he initially started using the CPAP machine. However, since receiving a new machine, he feels his sleep quality has declined, which he suspects may be affecting his blood pressure. He has not had any recent imaging of his heart but has had EKGs in the past. He also has a history of asthma, which he experiences during pollen season, but does not believe it contributes to his current shortness of breath.  He reports urinary symptoms suggestive of benign prostatic hyperplasia (BPH), including a weak urine stream and difficulty initiating urination, especially after holding it for a while. His PSA level was 2.2, an increase from 1.3 four years ago.  He is currently using a CPAP machine for sleep apnea, which he obtained through his job. The mask goes into his nose and does not cover his mouth. He is considering changing his pharmacy from [facility] to [facility] due to service delays.       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   Asthma, chronic 07/15/2012   Male hypogonadism 07/15/2012   Annual physical exam 07/09/2012   Skin tag 07/09/2012   Seasonal allergies 07/09/2012   Past Medical History:  Diagnosis Date   Allergy    Asthma    Calcium  nephrolithiasis    Hypogonadism in male    Obesity    Oxygen deficiency 2023   Past Surgical History:  Procedure Laterality Date   NO PAST SURGERIES     Social History   Tobacco Use   Smoking status: Former   Smokeless tobacco: Never  Substance Use Topics   Alcohol use: No   Drug use: No      ROS: as noted in HPI  Objective:     BP (!) 180/120   Pulse 69   Ht 5' 8 (1.727 m)   Wt 245 lb (111.1 kg)   SpO2 98%   BMI 37.25 kg/m  BP Readings from Last 3 Encounters:  04/01/24 (!) 180/120  03/04/24 (!) 140/80  03/04/24 (!) 150/110   Wt Readings from  Last 3 Encounters:  04/01/24 245 lb (111.1 kg)  03/04/24 244 lb (110.7 kg)  03/04/24 244 lb (110.7 kg)      Physical Exam Vitals and nursing note reviewed. Exam conducted with a chaperone present.  Constitutional:      General: He is not in acute distress.    Appearance: Normal appearance. He is not ill-appearing, toxic-appearing or diaphoretic.  HENT:     Head: Normocephalic and atraumatic.     Mouth/Throat:     Mouth: Mucous membranes are moist.     Pharynx: Oropharynx is clear. No oropharyngeal exudate or posterior oropharyngeal erythema.  Eyes:     General: No scleral icterus.        Right eye: No discharge.        Left eye: No discharge.     Extraocular Movements: Extraocular movements intact.     Pupils: Pupils are equal, round, and reactive to light.  Cardiovascular:     Rate and Rhythm: Normal rate and regular rhythm.  Pulmonary:     Effort: Pulmonary effort is normal. No respiratory distress.     Breath sounds: Normal breath sounds. No stridor. No wheezing or rhonchi.  Musculoskeletal:     Cervical back: Normal range of motion and neck supple. No rigidity or tenderness.     Right lower leg: No edema.     Left lower leg: No edema.  Lymphadenopathy:     Cervical: No cervical adenopathy.  Skin:    General: Skin is warm and dry.     Coloration: Skin is not jaundiced.  Neurological:     General: No focal deficit present.     Mental Status: He is alert and oriented to person, place, and time.      No results found for any visits on 04/01/24.  Last CBC Lab Results  Component Value Date   WBC 6.2 03/04/2024   HGB 15.6 03/04/2024   HCT 46.9 03/04/2024   MCV 90 03/04/2024   MCH 29.8 03/04/2024   RDW 14.4 03/04/2024   PLT 184 03/04/2024   Last metabolic panel Lab Results  Component Value Date   GLUCOSE 110 (H) 03/04/2024   NA 141 03/04/2024   K 4.0 03/04/2024   CL 102 03/04/2024   CO2 22 03/04/2024   BUN 9 03/04/2024   CREATININE 0.94 03/04/2024   EGFR 98 03/04/2024   CALCIUM  9.5 03/04/2024   PROT 7.2 03/04/2024   ALBUMIN 4.6 03/04/2024   LABGLOB 2.6 03/04/2024   BILITOT 0.7 03/04/2024   ALKPHOS 90 03/04/2024   AST 20 03/04/2024   ALT 22 03/04/2024   Last lipids Lab Results  Component Value Date   CHOL 148 03/04/2024   HDL 42 03/04/2024   LDLCALC 93 03/04/2024   TRIG 63 03/04/2024   CHOLHDL 3.5 03/04/2024   Last hemoglobin A1c Lab Results  Component Value Date   HGBA1C 5.6 03/04/2024   Last thyroid functions Lab Results  Component Value Date   TSH 2.010 03/04/2024   Last vitamin D  Lab Results  Component Value Date    VD25OH 28.1 (L) 03/04/2024   Last vitamin B12 and Folate Lab Results  Component Value Date   VITAMINB12 854 03/04/2024   FOLATE 10.0 03/04/2024      The 10-year ASCVD risk score (Arnett DK, et al., 2019) is: 9.7%  Assessment & Plan:  Benign essential hypertension -     Alfuzosin HCl ER; Take 1 tablet (10 mg total) by mouth daily with breakfast.  Dispense: 90  tablet; Refill: 0  Benign prostatic hyperplasia with urinary hesitancy -     Alfuzosin HCl ER; Take 1 tablet (10 mg total) by mouth daily with breakfast.  Dispense: 90 tablet; Refill: 0  BMI 37.0-37.9, adult  Immunization due -     Pneumococcal conjugate vaccine 20-valent  DOE (dyspnea on exertion)  Assessment and Plan    Essential hypertension Hypertension with systolic readings of 164 mmHg. CPAP issues may contribute to variability but not fully responsible. Goal is 120 mmHg systolic. - Prescribe alfazosin for hypertension and BPH. - Schedule nurse visit in two weeks for blood pressure check and side effect assessment. - BP went up upon recheck in office - pt has been monitoring at home with avg reading 160mg . - Advise home blood pressure monitoring, especially an hour post-medication. - Discuss potential alfazosin side effects, including hypotension.  Benign prostatic hyperplasia with lower urinary tract symptoms Weak urine flow and hesitancy noted. PSA increased from 1.3 to 2.2 over four years, indicating prostate enlargement. Alfazosin proposed for BPH and hypertension. - Prescribe alfazosin for BPH symptoms. - Monitor urine stream improvement with alfazosin.  Obstructive sleep apnea CPAP machine issues affecting sleep quality and blood pressure control. Improper fit may lead to suboptimal therapy and nocturnal hypoxia. - Advise contacting supplier for better fitting CPAP mask.  DOE  Upon climbing stairs only, does not occur while on flat ground. Will start with lowering BP. If sx do not improve, will obtain  further imaging (EKG, cxr, etc)  General Health Maintenance Due for COVID-19 and pneumonia vaccinations. - Administer pneumonia vaccine.         Return in about 3 months (around 07/02/2024).   Benton LITTIE Gave, PA

## 2024-04-01 NOTE — Progress Notes (Signed)
 Pt blood pressure reading on his at home machine today was 165/95 HR 65.

## 2024-04-05 ENCOUNTER — Ambulatory Visit: Payer: Self-pay

## 2024-04-05 NOTE — Telephone Encounter (Signed)
 FYI Only or Action Required?: Action required by provider: clinical question for provider and pt requesting alternate medication prescribed due to SE.  Patient was last seen in primary care on 04/01/2024 by Lowella Benton CROME, PA.  Called Nurse Triage reporting Medication Reaction.  Symptoms began several days ago.  Interventions attempted: Other: pt changed administration to PM vs AM.  Symptoms are: unchanged.  Triage Disposition: Call PCP Now  Patient/caregiver understands and will follow disposition?: Yes    Copied from CRM [id number]. Topic: Clinical - Red Word Triage >> Apr 05, 2024  8:07 AM Rosaria BRAVO wrote: Red Word that prompted transfer to Nurse Triage: Medicine making his heart race, headaches, started after taking new medicine. Reports being tired as well. Reason for Disposition  [1] Caller has URGENT medicine question about med that primary care doctor (or NP/PA) or specialist prescribed AND [2] triager unable to answer question  Answer Assessment - Initial Assessment Questions 1. NAME of MEDICINE: What medicine(s) are you calling about?     Alfuzosin 2. QUESTION: What is your question? (e.g., double dose of medicine, side effect)     Should pt continue or change medication 3. PRESCRIBER: Who prescribed the medicine? Reason: if prescribed by specialist, call should be referred to that group.     [place], [place] 4. SYMPTOMS: Do you have any symptoms? If Yes, ask: What symptoms are you having?  How bad are the symptoms (e.g., mild, moderate, severe)       Pt notes he started new medication on Thursday for BPH, he began taking the medication in the evenings because he notes he is having increased HR shortly after taking the dose and lasts for a couple hours. Denies CP, vision changes, HA, numbness, weakness. Reports mild SOB with episodes. Pt requesting medication change.  Protocols used: Medication Question Call-A-AH

## 2024-04-13 NOTE — Progress Notes (Unsigned)
   Subjective:    Patient ID: Jose Rojas, male    DOB: July 07, 1970, 53 y.o.   MRN: [id number]  HPI  Encounter for HTN nurse visit. Last OV reading was 180/120. Patient prescribed alfuzosin 10 mg. Patient called in on 04/05/24 and reported unwanted side effects. Whitney recommended pt cut pill in half to 5 mg and let us  know if he was still having symptoms. Denies CP, SOB, palpitations, or vision changes.   Review of Systems     Objective:           Assessment & Plan:   Patients first BP reading was. Pt made to sit for 10 mins and then it was retaken. The second reading was. Reported to PCP Whitney who would like to

## 2024-04-14 ENCOUNTER — Ambulatory Visit

## 2024-04-15 ENCOUNTER — Encounter

## 2024-04-15 NOTE — Progress Notes (Signed)
 No show. Patient was not seen.

## 2024-07-01 ENCOUNTER — Ambulatory Visit: Admitting: Urgent Care

## 2024-07-30 ENCOUNTER — Ambulatory Visit: Admitting: Urgent Care
